# Patient Record
Sex: Female | Born: 1965 | Race: White | Hispanic: No | Marital: Married | Smoking: Never smoker
Health system: Southern US, Community
[De-identification: ages and names within clinical notes are randomized; demographics above are authoritative.]

## PROBLEM LIST (undated history)

## (undated) DIAGNOSIS — F329 Major depressive disorder, single episode, unspecified: Secondary | ICD-10-CM

## (undated) DIAGNOSIS — F32A Depression, unspecified: Secondary | ICD-10-CM

## (undated) DIAGNOSIS — K649 Unspecified hemorrhoids: Secondary | ICD-10-CM

## (undated) DIAGNOSIS — I499 Cardiac arrhythmia, unspecified: Secondary | ICD-10-CM

## (undated) DIAGNOSIS — K589 Irritable bowel syndrome without diarrhea: Secondary | ICD-10-CM

## (undated) DIAGNOSIS — G43909 Migraine, unspecified, not intractable, without status migrainosus: Secondary | ICD-10-CM

## (undated) HISTORY — PX: CYSTECTOMY: SUR359

## (undated) HISTORY — PX: LAPAROSCOPY: SHX197

## (undated) HISTORY — PX: HEMORRHOID SURGERY: SHX153

## (undated) HISTORY — DX: Unspecified hemorrhoids: K64.9

## (undated) HISTORY — PX: WISDOM TOOTH EXTRACTION: SHX21

## (undated) HISTORY — PX: BREAST SURGERY: SHX581

---

## 2003-05-05 ENCOUNTER — Ambulatory Visit (HOSPITAL_COMMUNITY): Admission: RE | Admit: 2003-05-05 | Discharge: 2003-05-05 | Payer: Self-pay | Admitting: Internal Medicine

## 2003-05-05 ENCOUNTER — Encounter: Payer: Self-pay | Admitting: Internal Medicine

## 2003-05-24 ENCOUNTER — Encounter (INDEPENDENT_AMBULATORY_CARE_PROVIDER_SITE_OTHER): Payer: Self-pay | Admitting: Specialist

## 2003-05-24 ENCOUNTER — Ambulatory Visit (HOSPITAL_COMMUNITY): Admission: RE | Admit: 2003-05-24 | Discharge: 2003-05-24 | Payer: Self-pay | Admitting: Internal Medicine

## 2004-04-16 ENCOUNTER — Emergency Department (HOSPITAL_COMMUNITY): Admission: EM | Admit: 2004-04-16 | Discharge: 2004-04-17 | Payer: Self-pay | Admitting: Emergency Medicine

## 2005-07-26 ENCOUNTER — Emergency Department (HOSPITAL_COMMUNITY): Admission: EM | Admit: 2005-07-26 | Discharge: 2005-07-26 | Payer: Self-pay | Admitting: Emergency Medicine

## 2008-02-13 ENCOUNTER — Emergency Department (HOSPITAL_COMMUNITY): Admission: EM | Admit: 2008-02-13 | Discharge: 2008-02-14 | Payer: Self-pay | Admitting: Emergency Medicine

## 2008-07-03 ENCOUNTER — Ambulatory Visit (HOSPITAL_COMMUNITY): Admission: RE | Admit: 2008-07-03 | Discharge: 2008-07-03 | Payer: Self-pay | Admitting: Endocrinology

## 2010-10-07 ENCOUNTER — Other Ambulatory Visit: Payer: Self-pay | Admitting: Radiology

## 2010-10-12 ENCOUNTER — Encounter: Payer: Self-pay | Admitting: Family Medicine

## 2011-02-07 NOTE — H&P (Signed)
NAME:  Anna Hobbs, Anna Hobbs                         ACCOUNT NO.:  [id number]   MEDICAL RECORD NO.:  [id number]                   PATIENT TYPE:  AMB   LOCATION:  ENDO                                 FACILITY:  [facility]   PHYSICIAN:  Lina Sar, M.D. [facility]               DATE OF BIRTH:  04-04-1966   DATE OF ADMISSION:  05/24/2003  DATE OF DISCHARGE:                                HISTORY & PHYSICAL   PROCEDURE:  Colonoscopy.   INDICATIONS:  This 45 year old white female has had chronic diffuse  abdominal pain, off and on for many years.  It has most recently localized  to the right lower quadrant.  The patient is adopted; and therefore, there  is no family history to provide.  The patient has been tried on  antispasmodics as well as on antidepressants with not much relief of her  symptoms.  Her bowel habits are alternating diarrhea and constipation.  She  also has intermittent rectal bleeding.  She is undergoing colonoscopy to  evaluate her for inflammatory bowel disease.  A small-bowel follow through  last week was normal, showing normal terminal ileum.   ENDOSCOPE:  Olympus single channel videoendoscope .   SEDATION:  Versed 7 mg IV, Fentanyl 100 mcg IV.   FINDINGS:  The Olympus single channel videoendoscope was passed through the  sphincter under direct vision through the rectum to the sigmoid colon.  The  patient was monitored by pulse oximeter.  Oxygen saturations were normal.  Her prep was excellent.  Anal canal showed small first grade hemorrhoids.  The rectal ampulla was normal.  Sigmoid colon was normal-appearing with no  evidence of diverticulosis.  Ostial folds were unremarkable.  Colonoscope  passed easily to the left colon to the descending to splenic flexure,  submucosa was unremarkable.  Transverse colon, hepatic flexure, and  ascending colon were traversed without difficulty.  The colon was rather  long.  The right colon and the cecum were unremarkable.  The ileocecal  valve  did not appear to be deformed.  It appeared normal including the appendiceal  opening.  Colonoscope was then retracted and video photographs of the cecum  were obtained.  Multiple random biopsies were taken from mostly the left  colon to rule out microscopic colitis.   IMPRESSION:  1. Normal colonoscopy to the cecum.  2. Status post random biopsies.   PLAN:  There are no structural abnormalities the patient's colon also her  small-bowel follow through was normal so, there is really no evidence of  inflammatory bowel disease.  Her abdominal pain is likely related to  function GI  problem and we will give her a trial of Zelnorm, since she could not  tolerate the antispasmodic , __________.  Zelnorm will be started at a low  dose of 3 mg a day and may be increased gradually to 6 mg twice a day as  needed.  She was  also encouraged to comply with a high-fiber diet.                                                Lina Sar, M.D. [facility]    DB/MEDQ  D:  05/24/2003  T:  05/24/2003  Job:  [id number]   cc:   Tammy R. Collins [place], M.D.  [address]  [place]  [place] [address]  Fax: [phone]

## 2011-06-18 LAB — POCT CARDIAC MARKERS
CKMB, poc: 1.1
CKMB, poc: 1.2
Myoglobin, poc: 70.4

## 2011-06-18 LAB — POCT I-STAT, CHEM 8
HCT: 35 — ABNORMAL LOW
Hemoglobin: 11.9 — ABNORMAL LOW
Potassium: 3.4 — ABNORMAL LOW
Sodium: 142
TCO2: 17

## 2011-07-02 ENCOUNTER — Emergency Department (HOSPITAL_COMMUNITY): Payer: 59

## 2011-07-02 ENCOUNTER — Emergency Department (HOSPITAL_COMMUNITY)
Admission: EM | Admit: 2011-07-02 | Discharge: 2011-07-02 | Disposition: A | Discharge status: Home / Self Care | Payer: 59 | Attending: Emergency Medicine | Admitting: Emergency Medicine

## 2011-07-02 DIAGNOSIS — G43909 Migraine, unspecified, not intractable, without status migrainosus: Secondary | ICD-10-CM | POA: Insufficient documentation

## 2011-07-02 DIAGNOSIS — W2209XA Striking against other stationary object, initial encounter: Secondary | ICD-10-CM | POA: Insufficient documentation

## 2011-07-02 DIAGNOSIS — Y92009 Unspecified place in unspecified non-institutional (private) residence as the place of occurrence of the external cause: Secondary | ICD-10-CM | POA: Insufficient documentation

## 2011-07-02 DIAGNOSIS — F988 Other specified behavioral and emotional disorders with onset usually occurring in childhood and adolescence: Secondary | ICD-10-CM | POA: Insufficient documentation

## 2011-07-02 DIAGNOSIS — Y998 Other external cause status: Secondary | ICD-10-CM | POA: Insufficient documentation

## 2011-07-02 DIAGNOSIS — S060X0A Concussion without loss of consciousness, initial encounter: Secondary | ICD-10-CM | POA: Insufficient documentation

## 2011-07-02 DIAGNOSIS — Z79899 Other long term (current) drug therapy: Secondary | ICD-10-CM | POA: Insufficient documentation

## 2011-07-02 DIAGNOSIS — I499 Cardiac arrhythmia, unspecified: Secondary | ICD-10-CM | POA: Insufficient documentation

## 2012-04-29 ENCOUNTER — Emergency Department (HOSPITAL_COMMUNITY)
Admission: EM | Admit: 2012-04-29 | Discharge: 2012-04-30 | Disposition: A | Discharge status: Home / Self Care | Payer: 59 | Attending: Emergency Medicine | Admitting: Emergency Medicine

## 2012-04-29 ENCOUNTER — Encounter (HOSPITAL_COMMUNITY): Payer: Self-pay | Admitting: Emergency Medicine

## 2012-04-29 DIAGNOSIS — K644 Residual hemorrhoidal skin tags: Secondary | ICD-10-CM | POA: Insufficient documentation

## 2012-04-29 DIAGNOSIS — K649 Unspecified hemorrhoids: Secondary | ICD-10-CM

## 2012-04-29 HISTORY — DX: Migraine, unspecified, not intractable, without status migrainosus: G43.909

## 2012-04-29 LAB — COMPREHENSIVE METABOLIC PANEL
AST: 18 U/L (ref 0–37)
BUN: 12 mg/dL (ref 6–23)
CO2: 25 mEq/L (ref 19–32)
Calcium: 9.1 mg/dL (ref 8.4–10.5)
Chloride: 104 mEq/L (ref 96–112)
Creatinine, Ser: 1.05 mg/dL (ref 0.50–1.10)
GFR calc non Af Amer: 63 mL/min — ABNORMAL LOW (ref >90)
Total Bilirubin: 0.5 mg/dL (ref 0.3–1.2)

## 2012-04-29 LAB — CBC
HCT: 40.9 % (ref 36.0–46.0)
MCH: 28.8 pg (ref 26.0–34.0)
MCV: 86.5 fL (ref 78.0–100.0)
Platelets: 175 10*3/uL (ref 150–400)
RBC: 4.73 MIL/uL (ref 3.87–5.11)
RDW: 13.8 % (ref 11.5–15.5)
WBC: 5.8 10*3/uL (ref 4.0–10.5)

## 2012-04-29 LAB — ACETAMINOPHEN LEVEL: Acetaminophen (Tylenol), Serum: <15 ug/mL (ref 10–30)

## 2012-04-29 LAB — ETHANOL: Alcohol, Ethyl (B): <11 mg/dL (ref 0–11)

## 2012-04-29 NOTE — ED Notes (Signed)
Bed:WA16<BR> Expected date:<BR> Expected time:<BR> Means of arrival:<BR> Comments:<BR> Triage 

## 2012-04-29 NOTE — ED Notes (Signed)
Pt endorses C-diff in January 2013, developed Hemorroids, internal and external dx by flex sigmoidoscopy by [facility] in [place]. Has had only Sx treatment since w/o any resolution. Next appt 05/11/12. Pt extremely frustrated and is needs some intervention for pain. Is missing work and unable to sleep d/t pain.

## 2012-04-30 MED ORDER — HYDROCORTISONE 2.5 % RE CREA: TOPICAL_CREAM | Freq: Two times a day (BID) | RECTAL | Status: AC

## 2012-04-30 MED ORDER — LIDOCAINE 5 % EX OINT
TOPICAL_OINTMENT | Freq: Every day | CUTANEOUS | Status: DC | PRN
  Administered 2012-04-30: 01:00:00 via TOPICAL
  Filled 2012-04-30: qty 35.44

## 2012-04-30 MED ORDER — OXYCODONE-ACETAMINOPHEN 5-325 MG PO TABS: 1.0000 | ORAL_TABLET | Freq: Four times a day (QID) | ORAL | Status: AC | PRN

## 2012-04-30 MED ORDER — OXYCODONE-ACETAMINOPHEN 5-325 MG PO TABS
1.0000 | ORAL_TABLET | Freq: Once | ORAL | Status: AC
  Administered 2012-04-30: 1 via ORAL
  Filled 2012-04-30: qty 1

## 2012-04-30 NOTE — ED Provider Notes (Signed)
Medical screening examination/treatment/procedure(s) were performed by non-physician practitioner and as supervising physician I was immediately available for consultation/collaboration.  Sunnie Nielsen, MD 04/30/12 [phone]

## 2012-04-30 NOTE — ED Provider Notes (Signed)
History     CSN: [id number]  Arrival date & time 04/29/12  2007   First MD Initiated Contact with Patient 04/30/12 0005      Chief Complaint  Patient presents with  . Hemorrhoids    since January, unable to see surgeon for surgical intervention    (Consider location/radiation/quality/duration/timing/severity/associated sxs/prior treatment) HPI Comments: Patient 46 year old female that presents emergency department with the chief complaint of extreme pain from internal and external hemorrhoids.  Patient states that she was diagnosed with C. 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  Patient reports that she had a flex sigmoidoscopy confirming this diagnosis recently.  She's been placed on stool softeners and linzess without any relief.  She denies fever, night sweats, chills.  No other complaints at this time.  The history is provided by the patient.    Past Medical History  Diagnosis Date  . Migraine     Past Surgical History  Procedure Date  . Laparoscopy   . Breast surgery     needle core bx    No family history on file.  History  Substance Use Topics  . Smoking status: Never Smoker   . Smokeless tobacco: Never Used  . Alcohol Use: No    OB History    Grav Para Term Preterm Abortions TAB SAB Ect Mult Living                  Review of Systems  Constitutional: Negative for fever, chills and appetite change.  HENT: Negative for congestion.   Eyes: Negative for visual disturbance.  Respiratory: Negative for shortness of breath.   Cardiovascular: Negative for chest pain and leg swelling.  Gastrointestinal: Positive for rectal pain. Negative for abdominal pain.  Genitourinary: Negative for dysuria, urgency and frequency.  Neurological: Negative for dizziness, syncope, weakness, light-headedness, numbness and headaches.  Psychiatric/Behavioral: Negative for confusion.  All other systems reviewed and are  negative.    Allergies  Azithromycin; Cefdinir; and Metronidazole  Home Medications   Current Outpatient Rx  Name Route Sig Dispense Refill  . BUPROPION HCL ER (SR) 200 MG PO TB12 Oral Take 200 mg by mouth 2 (two) times daily.    Marland Kitchen METOPROLOL SUCCINATE ER 25 MG PO TB24 Oral Take 25 mg by mouth at bedtime.    Marland Kitchen NAPROXEN SODIUM 220 MG PO CAPS Oral Take 220 mg by mouth 2 (two) times daily as needed. For pain    . TOPIRAMATE 50 MG PO TABS Oral Take 50 mg by mouth at bedtime.      BP 110/77  Pulse 91  Temp 98 F (36.7 C) (Oral)  Resp 18  Ht 5\' 2"  (1.575 m)  Wt 122 lb (55.339 kg)  BMI 22.31 kg/m2  SpO2 99%  LMP 04/29/2012  Physical Exam  Nursing note and vitals reviewed. Constitutional: She is oriented to person, place, and time. She appears well-developed and well-nourished. No distress.  HENT:  Head: Normocephalic and atraumatic.  Eyes: Conjunctivae and EOM are normal.  Neck: Normal range of motion.  Pulmonary/Chest: Effort normal.  Genitourinary:       Rectal exam chaperoned.   showed no evidence of para rectal abscess.  Extreme tenderness to palpation of external hemorrhoids. NO evidence of incarnation or strangulation. No active bleeding.  Rectal exam not tolerating the due to pain   Musculoskeletal: Normal range of motion.  Neurological: She is alert and oriented to person, place, and time.  Skin:  Skin is warm and dry. No rash noted. She is not diaphoretic.  Psychiatric: She has a normal mood and affect. Her behavior is normal.    ED Course  Procedures (including critical care time)  Labs Reviewed  COMPREHENSIVE METABOLIC PANEL - Abnormal; Notable for the following:    Potassium 3.4 (*)     Glucose, Bld 112 (*)     GFR calc non Af Amer 63 (*)     GFR calc Af Amer 73 (*)     All other components within normal limits  CBC  ETHANOL  ACETAMINOPHEN LEVEL   No results found.   No diagnosis found.    MDM  Hemorrhoids  Presents to ER c/o hemorrhoids after  failing at home therapy. Pt stable without evidence of incarnation or strangulation. Patient will be discharged with instructions for sitz bath, Percocet prescription, lidocaine ointment, and instructions to get a donut.  Recommended consultation with general surgery for removal of hemorrhoids.  Patient is agreeable to plan.          Jaci Carrel, PA-C 04/30/12 0049  Jaci Carrel, PA-C 04/30/12 [phone]

## 2012-05-14 ENCOUNTER — Encounter (INDEPENDENT_AMBULATORY_CARE_PROVIDER_SITE_OTHER): Payer: Self-pay | Admitting: Surgery

## 2012-05-14 ENCOUNTER — Ambulatory Visit (INDEPENDENT_AMBULATORY_CARE_PROVIDER_SITE_OTHER): Payer: 59 | Admitting: Surgery

## 2012-05-14 VITALS — BP 100/62 | HR 72 | Temp 98.5°F | Resp 16 | Ht 62.0 in | Wt 122.4 lb

## 2012-05-14 DIAGNOSIS — K649 Unspecified hemorrhoids: Secondary | ICD-10-CM

## 2012-05-14 DIAGNOSIS — G43909 Migraine, unspecified, not intractable, without status migrainosus: Secondary | ICD-10-CM | POA: Insufficient documentation

## 2012-05-14 MED ORDER — TRAMADOL HCL 50 MG PO TABS: 50.0000 mg | ORAL_TABLET | Freq: Four times a day (QID) | ORAL | Status: AC | PRN

## 2012-05-14 NOTE — Patient Instructions (Signed)
Hemorrhoidectomy Hemorrhoidectomy is surgery to remove hemorrhoids. Hemorrhoids are veins that have become swollen in the rectum. The rectum is the area from the bottom end of the intestines to the opening where bowel movements leave the body. Hemorrhoids can be uncomfortable. They can cause itching, bleeding and pain if a blood clot forms in them (thrombose). If hemorrhoids are small, surgery may not be needed. But if they cover a larger area, surgery is usually suggested.  LET YOUR CAREGIVER KNOW ABOUT:   Any allergies.   All medications you are taking, including:   Herbs, eyedrops, over-the-counter medications and creams.   Blood thinners (anticoagulants), aspirin or other drugs that could affect blood clotting.   Use of steroids (by mouth or as creams).   Previous problems with anesthetics, including local anesthetics.   Possibility of pregnancy, if this applies.   Any history of blood clots.   Any history of bleeding or other blood problems.   Previous surgery.   Smoking history.   Other health problems.  RISKS AND COMPLICATIONS All surgery carries some risk. However, hemorrhoid surgery usually goes smoothly. Possible complications could include:  Urinary retention.   Bleeding.   Infection.   A painful incision.   A reaction to the anesthesia (this is not common).  BEFORE THE PROCEDURE   Stop using aspirin and non-steroidal anti-inflammatory drugs (NSAIDs) for pain relief. This includes prescription drugs and over-the-counter drugs such as ibuprofen and naproxen. Also stop taking vitamin E. If possible, do this two weeks before your surgery.   If you take blood-thinners, ask your healthcare provider when you should stop taking them.   You will probably have blood and urine tests done several days before your surgery.   Do not eat or drink for about 8 hours before the surgery.   Arrive at least an hour before the surgery, or whenever your surgeon recommends. This  will give you time to check in and fill out any needed paperwork.   Hemorrhoidectomy is often an outpatient procedure. This means you will be able to go home the same day. Sometimes, though, people stay overnight in the hospital after the procedure. Ask your surgeon what to expect. Either way, make arrangements in advance for someone to drive you home.  PROCEDURE   The preparation:   You will change into a hospital gown.   You will be given an IV. A needle will be inserted in your arm. Medication can flow directly into your body through this needle.   You might be given an enema to clear your rectum.   Once in the operating room, you will probably lie on your side or be repositioned later to lying on your stomach.   You will be given anesthesia (medication) so you will not feel anything during the surgery. The surgery often is done with local anesthesia (the area near the hemorrhoids will be numb and you will be drowsy but awake). Sometimes, general anesthesia is used (you will be asleep during the procedure).   The procedure:   There are a few different procedures for hemorrhoids. Be sure to ask you surgeon about the procedure, the risks and benefits.   Be sure to ask about what you need to do to take care of the wound, if there is one.  AFTER THE PROCEDURE  You will stay in a recovery area until the anesthesia has worn off. Your blood pressure and pulse will be checked every so often.   You may feel a lot of  pain in the area of the rectum.   Take all pain medication prescribed by your surgeon. Ask before taking any over-the-counter pain medicines.   Sometimes sitting in a warm bath can help relieve your pain.   To make sure you have bowel movements without straining:   You will probably need to take stool softeners (usually a pill) for a few days.   You should drink 8 to 10 glasses of water each day.   Your activity will be restricted for awhile. Ask your caregiver for a list  of what you should and should not do while you recover.  Document Released: 07/06/2009 Document Revised: 08/28/2011 Document Reviewed: 07/06/2009 [facility] Patient Information 2012 [place], [place].

## 2012-05-14 NOTE — Progress Notes (Signed)
Chief Complaint:  Painful hemorrhoids  History of Present Illness:  Anna Hobbs is an 46 y.o. female who is referred by Dr. Drue Novel with hemorrhoids.  She has had C. difficile since taking a Z-Pak earlier this year. With her diarrhea that is exacerbated her hemorrhoids that she got originally with pregnancy. She denies much in the way of bleeding amount of severe pain that doubled her over and caused her to sleep sitting up.  Following examination I discussed my findings which included some modest internal hemorrhoids posteriorly that could possibly prolapse and asymptomatic lung was external skin tags, what on palpation feels to be maybe a fissure posteriorly which may well account for her severe pain and spasm. I discussed internal sphincterotomy with her in some detail we gave her a booklet on this. Also discussed hemorrhoidectomy in the rationale for exam under anesthesia. Will try her on diltiazem cream first to see if that helps his pain. I was to try to avoid hemorrhoidectomy if possible but I think she and her husband want these addressed surgically.  The plan is for an EUA, lateral internal sphincterotomy, possible hemorrhoidectomy.  Past Medical History  Diagnosis Date  . Migraine     Past Surgical History  Procedure Date  . Laparoscopy   . Breast surgery     needle core bx  . Cystectomy     Current Outpatient Prescriptions  Medication Sig Dispense Refill  . buPROPion (WELLBUTRIN SR) 200 MG 12 hr tablet Take 200 mg by mouth 2 (two) times daily.      . metoprolol succinate (TOPROL-XL) 25 MG 24 hr tablet Take 25 mg by mouth at bedtime.      . Naproxen Sodium (ALEVE) 220 MG CAPS Take 220 mg by mouth 2 (two) times daily as needed. For pain      . topiramate (TOPAMAX) 50 MG tablet Take 50 mg by mouth at bedtime.      . traMADol (ULTRAM) 50 MG tablet Take 1-2 tablets (50-100 mg total) by mouth every 6 (six) hours as needed for pain.  30 tablet  1   Azithromycin; Cefdinir; and  Metronidazole Family History  Problem Relation Age of Onset  . Adopted: Yes  . Family history unknown: Yes   Social History:   reports that she has never smoked. She has never used smokeless tobacco. She reports that she does not drink alcohol or use illicit drugs.   REVIEW OF SYSTEMS - PERTINENT POSITIVES ONLY: She takes Topamax for migraines  Physical Exam:   Blood pressure 100/62, pulse 72, temperature 98.5 F (36.9 C), resp. rate 16, height 5\' 2"  (1.575 m), weight 122 lb 6.4 oz (55.52 kg), last menstrual period 04/29/2012. Body mass index is 22.39 kg/(m^2).  Gen:  WDWN white female NAD  Neurological: Alert and oriented to person, place, and time. Motor and sensory function is grossly intact  Head: Normocephalic and atraumatic.  Eyes: Conjunctivae are normal. Pupils are equal, round, and reactive to light. No scleral icterus.  Neck: Normal range of motion. Neck supple. No tracheal deviation or thyromegaly present.  Cardiovascular:  SR without murmurs or gallops.  No carotid bruits Respiratory: Effort normal.  No respiratory distress. No chest wall tenderness. Breath sounds normal.  No wheezes, rales or rhonchi.  Abdomen:  nontender GU: Rectal:  Posterior column hemorrhoids.  Tender area posteriorally likely a fissure Musculoskeletal: Normal range of motion. Extremities are nontender. No cyanosis, edema or clubbing noted Lymphadenopathy: No cervical, preauricular, postauricular or axillary adenopathy is present  Skin: Skin is warm and dry. No rash noted. No diaphoresis. No erythema. No pallor. Pscyh: Normal mood and affect. Behavior is normal. Judgment and thought content normal.   LABORATORY RESULTS: No results found for this or any previous visit (from the past 48 hour(s)).  RADIOLOGY RESULTS: No results found.  Problem List: There is no problem list on file for this patient.   Assessment & Plan: Anal spasm, hemorrhoids and probable fissure.  Plan EUA with lat int  sphincteroromy and hemorrhoidectomy    Matt B. Daphine Deutscher, MD, [facility] [phone] beeper [phone]  05/14/2012 2:27 PM

## 2012-05-17 ENCOUNTER — Other Ambulatory Visit: Payer: Self-pay

## 2012-05-17 ENCOUNTER — Encounter (HOSPITAL_BASED_OUTPATIENT_CLINIC_OR_DEPARTMENT_OTHER)
Admission: RE | Admit: 2012-05-17 | Discharge: 2012-05-17 | Disposition: A | Discharge status: Home / Self Care | Payer: 59 | Source: Ambulatory Visit | Attending: Surgery | Admitting: Surgery

## 2012-05-17 ENCOUNTER — Encounter (HOSPITAL_BASED_OUTPATIENT_CLINIC_OR_DEPARTMENT_OTHER): Payer: Self-pay | Admitting: *Deleted

## 2012-05-17 LAB — BASIC METABOLIC PANEL
Calcium: 9.3 mg/dL (ref 8.4–10.5)
Creatinine, Ser: 0.96 mg/dL (ref 0.50–1.10)
GFR calc Af Amer: 81 mL/min — ABNORMAL LOW (ref >90)
GFR calc non Af Amer: 70 mL/min — ABNORMAL LOW (ref >90)
Sodium: 138 mEq/L (ref 135–145)

## 2012-05-17 NOTE — Progress Notes (Signed)
Here for ekg-bmet-toprol for hx pvc-will try to find old recoerds-not sure who she saw about 2008

## 2012-05-19 ENCOUNTER — Encounter (HOSPITAL_BASED_OUTPATIENT_CLINIC_OR_DEPARTMENT_OTHER): Payer: Self-pay | Admitting: Anesthesiology

## 2012-05-19 ENCOUNTER — Ambulatory Visit (HOSPITAL_BASED_OUTPATIENT_CLINIC_OR_DEPARTMENT_OTHER): Payer: 59 | Admitting: Anesthesiology

## 2012-05-19 ENCOUNTER — Ambulatory Visit (HOSPITAL_BASED_OUTPATIENT_CLINIC_OR_DEPARTMENT_OTHER)
Admission: RE | Admit: 2012-05-19 | Discharge: 2012-05-19 | Disposition: A | Discharge status: Home / Self Care | Payer: 59 | Source: Ambulatory Visit | Attending: Surgery | Admitting: Surgery

## 2012-05-19 ENCOUNTER — Encounter (HOSPITAL_BASED_OUTPATIENT_CLINIC_OR_DEPARTMENT_OTHER)
Admission: RE | Disposition: A | Discharge status: Home / Self Care | Payer: Self-pay | Source: Ambulatory Visit | Attending: Surgery

## 2012-05-19 ENCOUNTER — Encounter (HOSPITAL_BASED_OUTPATIENT_CLINIC_OR_DEPARTMENT_OTHER): Payer: Self-pay

## 2012-05-19 DIAGNOSIS — F329 Major depressive disorder, single episode, unspecified: Secondary | ICD-10-CM | POA: Insufficient documentation

## 2012-05-19 DIAGNOSIS — K648 Other hemorrhoids: Secondary | ICD-10-CM

## 2012-05-19 DIAGNOSIS — K644 Residual hemorrhoidal skin tags: Secondary | ICD-10-CM | POA: Insufficient documentation

## 2012-05-19 DIAGNOSIS — F3289 Other specified depressive episodes: Secondary | ICD-10-CM | POA: Insufficient documentation

## 2012-05-19 DIAGNOSIS — G43909 Migraine, unspecified, not intractable, without status migrainosus: Secondary | ICD-10-CM | POA: Insufficient documentation

## 2012-05-19 DIAGNOSIS — I499 Cardiac arrhythmia, unspecified: Secondary | ICD-10-CM | POA: Insufficient documentation

## 2012-05-19 HISTORY — DX: Major depressive disorder, single episode, unspecified: F32.9

## 2012-05-19 HISTORY — DX: Irritable bowel syndrome, unspecified: K58.9

## 2012-05-19 HISTORY — DX: Cardiac arrhythmia, unspecified: I49.9

## 2012-05-19 HISTORY — DX: Depression, unspecified: F32.A

## 2012-05-19 LAB — POCT HEMOGLOBIN-HEMACUE: Hemoglobin: 11.5 g/dL — ABNORMAL LOW (ref 12.0–15.0)

## 2012-05-19 SURGERY — SPHINCTEROTOMY, ANAL
Anesthesia: General | Site: Anus | Wound class: Clean Contaminated

## 2012-05-19 MED ORDER — ACETAMINOPHEN 325 MG PO TABS: 650.0000 mg | ORAL_TABLET | ORAL | Status: DC | PRN

## 2012-05-19 MED ORDER — OXYCODONE HCL 5 MG/5ML PO SOLN: 5.0000 mg | Freq: Once | ORAL | Status: AC | PRN

## 2012-05-19 MED ORDER — FENTANYL CITRATE 0.05 MG/ML IJ SOLN
INTRAMUSCULAR | Status: DC | PRN
  Administered 2012-05-19: 100 ug via INTRAVENOUS

## 2012-05-19 MED ORDER — SCOPOLAMINE 1 MG/3DAYS TD PT72
1.0000 | MEDICATED_PATCH | Freq: Once | TRANSDERMAL | Status: DC
  Administered 2012-05-19: 1.5 mg via TRANSDERMAL

## 2012-05-19 MED ORDER — BUPIVACAINE LIPOSOME 1.3 % IJ SUSP
INTRAMUSCULAR | Status: DC | PRN
  Administered 2012-05-19: 20 mL

## 2012-05-19 MED ORDER — SODIUM CHLORIDE 0.9 % IJ SOLN: 3.0000 mL | Freq: Two times a day (BID) | INTRAMUSCULAR | Status: DC

## 2012-05-19 MED ORDER — OXYCODONE HCL 5 MG PO TABS: 5.0000 mg | ORAL_TABLET | ORAL | Status: DC | PRN

## 2012-05-19 MED ORDER — MIDAZOLAM HCL 5 MG/5ML IJ SOLN
INTRAMUSCULAR | Status: DC | PRN
  Administered 2012-05-19: 2 mg via INTRAVENOUS

## 2012-05-19 MED ORDER — METOCLOPRAMIDE HCL 5 MG/ML IJ SOLN: 10.0000 mg | Freq: Once | INTRAMUSCULAR | Status: DC | PRN

## 2012-05-19 MED ORDER — ONDANSETRON HCL 4 MG/2ML IJ SOLN: 4.0000 mg | Freq: Four times a day (QID) | INTRAMUSCULAR | Status: DC | PRN

## 2012-05-19 MED ORDER — OXYCODONE-ACETAMINOPHEN 5-325 MG PO TABS: 1.0000 | ORAL_TABLET | ORAL | Status: AC | PRN

## 2012-05-19 MED ORDER — LACTATED RINGERS IV SOLN
INTRAVENOUS | Status: DC
  Administered 2012-05-19 (×2): via INTRAVENOUS

## 2012-05-19 MED ORDER — FLEET ENEMA 7-19 GM/118ML RE ENEM: 1.0000 | ENEMA | Freq: Once | RECTAL | Status: DC

## 2012-05-19 MED ORDER — CIPROFLOXACIN IN D5W 400 MG/200ML IV SOLN
400.0000 mg | INTRAVENOUS | Status: AC
  Administered 2012-05-19: 400 mg via INTRAVENOUS

## 2012-05-19 MED ORDER — KCL IN DEXTROSE-NACL 20-5-0.45 MEQ/L-%-% IV SOLN: INTRAVENOUS | Status: DC

## 2012-05-19 MED ORDER — METOPROLOL SUCCINATE ER 25 MG PO TB24
25.0000 mg | ORAL_TABLET | Freq: Every day | ORAL | Status: DC
  Administered 2012-05-19: 25 mg via ORAL

## 2012-05-19 MED ORDER — SODIUM CHLORIDE 0.9 % IV SOLN: 250.0000 mL | INTRAVENOUS | Status: DC | PRN

## 2012-05-19 MED ORDER — MORPHINE SULFATE 2 MG/ML IJ SOLN: 1.0000 mg | INTRAMUSCULAR | Status: DC | PRN

## 2012-05-19 MED ORDER — HEPARIN SODIUM (PORCINE) 5000 UNIT/ML IJ SOLN
5000.0000 [IU] | Freq: Once | INTRAMUSCULAR | Status: AC
  Administered 2012-05-19: 5000 [IU] via SUBCUTANEOUS

## 2012-05-19 MED ORDER — ACETAMINOPHEN 10 MG/ML IV SOLN
1000.0000 mg | Freq: Once | INTRAVENOUS | Status: AC
  Administered 2012-05-19: 1000 mg via INTRAVENOUS

## 2012-05-19 MED ORDER — LIDOCAINE HCL (CARDIAC) 20 MG/ML IV SOLN
INTRAVENOUS | Status: DC | PRN
  Administered 2012-05-19: 50 mg via INTRAVENOUS

## 2012-05-19 MED ORDER — ACETAMINOPHEN 650 MG RE SUPP: 650.0000 mg | RECTAL | Status: DC | PRN

## 2012-05-19 MED ORDER — PROPOFOL 10 MG/ML IV BOLUS
INTRAVENOUS | Status: DC | PRN
  Administered 2012-05-19: 200 mg via INTRAVENOUS

## 2012-05-19 MED ORDER — HYDROMORPHONE HCL PF 1 MG/ML IJ SOLN: 0.2500 mg | INTRAMUSCULAR | Status: DC | PRN

## 2012-05-19 MED ORDER — SODIUM CHLORIDE 0.9 % IJ SOLN: 3.0000 mL | INTRAMUSCULAR | Status: DC | PRN

## 2012-05-19 MED ORDER — OXYCODONE HCL 5 MG PO TABS
5.0000 mg | ORAL_TABLET | Freq: Once | ORAL | Status: AC | PRN
  Administered 2012-05-19: 5 mg via ORAL

## 2012-05-19 SURGICAL SUPPLY — 33 items
BLADE SURG 15 STRL LF DISP TIS (BLADE) ×1 IMPLANT
BLADE SURG 15 STRL SS (BLADE) ×2
CANISTER SUCTION 1200CC (MISCELLANEOUS) ×2 IMPLANT
CLOTH BEACON ORANGE TIMEOUT ST (SAFETY) ×2 IMPLANT
COVER MAYO STAND STRL (DRAPES) IMPLANT
DECANTER SPIKE VIAL GLASS SM (MISCELLANEOUS) ×2 IMPLANT
DRSG PAD ABDOMINAL 8X10 ST (GAUZE/BANDAGES/DRESSINGS) IMPLANT
ELECT REM PT RETURN 9FT ADLT (ELECTROSURGICAL) ×2
ELECTRODE REM PT RTRN 9FT ADLT (ELECTROSURGICAL) ×1 IMPLANT
GAUZE SPONGE 4X4 12PLY STRL LF (GAUZE/BANDAGES/DRESSINGS) ×4 IMPLANT
GAUZE VASELINE 1X8 (GAUZE/BANDAGES/DRESSINGS) IMPLANT
GLOVE BIO SURGEON STRL SZ 6.5 (GLOVE) ×1 IMPLANT
GLOVE BIO SURGEON STRL SZ8 (GLOVE) ×2 IMPLANT
GOWN PREVENTION PLUS XLARGE (GOWN DISPOSABLE) ×2 IMPLANT
GOWN PREVENTION PLUS XXLARGE (GOWN DISPOSABLE) ×2 IMPLANT
NDL HYPO 25X1 1.5 SAFETY (NEEDLE) ×1 IMPLANT
NEEDLE HYPO 25X1 1.5 SAFETY (NEEDLE) ×2 IMPLANT
PACK BASIN DAY SURGERY FS (CUSTOM PROCEDURE TRAY) ×2 IMPLANT
PACK LITHOTOMY IV (CUSTOM PROCEDURE TRAY) ×2 IMPLANT
PENCIL BUTTON HOLSTER BLD 10FT (ELECTRODE) ×2 IMPLANT
SHEET MEDIUM DRAPE 40X70 STRL (DRAPES) ×2 IMPLANT
SLEEVE SCD COMPRESS KNEE MED (MISCELLANEOUS) IMPLANT
SURGILUBE 2OZ TUBE FLIPTOP (MISCELLANEOUS) ×2 IMPLANT
SUT CHROMIC 2 0 SH (SUTURE) IMPLANT
SUT CHROMIC 3 0 SH 27 (SUTURE) ×3 IMPLANT
SYR CONTROL 10ML LL (SYRINGE) ×2 IMPLANT
TOWEL OR 17X24 6PK STRL BLUE (TOWEL DISPOSABLE) ×4 IMPLANT
TRAY DSU PREP LF (CUSTOM PROCEDURE TRAY) ×2 IMPLANT
TRAY PROCTOSCOPIC FIBER OPTIC (SET/KITS/TRAYS/PACK) IMPLANT
TUBE CONNECTING 20X1/4 (TUBING) ×2 IMPLANT
UNDERPAD 30X30 INCONTINENT (UNDERPADS AND DIAPERS) ×2 IMPLANT
WATER STERILE IRR 1000ML POUR (IV SOLUTION) ×1 IMPLANT
YANKAUER SUCT BULB TIP NO VENT (SUCTIONS) ×2 IMPLANT

## 2012-05-19 NOTE — Anesthesia Preprocedure Evaluation (Signed)
Anesthesia Evaluation  Patient identified by MRN, date of birth, ID band Patient awake    Reviewed: Allergy & Precautions, H&P , NPO status , Patient's Chart, lab work & pertinent test results, reviewed documented beta blocker date and time   Airway Mallampati: II TM Distance: >3 FB Neck ROM: full    Dental   Pulmonary neg pulmonary ROS,  breath sounds clear to auscultation        Cardiovascular + dysrhythmias Rhythm:regular     Neuro/Psych  Headaches, PSYCHIATRIC DISORDERS Depression    GI/Hepatic negative GI ROS, Neg liver ROS,   Endo/Other  negative endocrine ROS  Renal/GU negative Renal ROS  negative genitourinary   Musculoskeletal   Abdominal   Peds  Hematology negative hematology ROS (+)   Anesthesia Other Findings See surgeon's H&P   Reproductive/Obstetrics negative OB ROS                           Anesthesia Physical Anesthesia Plan  ASA: II  Anesthesia Plan: General   Post-op Pain Management:    Induction: Intravenous  Airway Management Planned: LMA  Additional Equipment:   Intra-op Plan:   Post-operative Plan: Extubation in OR  Informed Consent: I have reviewed the patients History and Physical, chart, labs and discussed the procedure including the risks, benefits and alternatives for the proposed anesthesia with the patient or authorized representative who has indicated his/her understanding and acceptance.   Dental Advisory Given  Plan Discussed with: CRNA and Surgeon  Anesthesia Plan Comments:         Anesthesia Quick Evaluation

## 2012-05-19 NOTE — Anesthesia Procedure Notes (Signed)
Procedure Name: LMA Insertion Date/Time: 05/19/2012 12:52 PM Performed by: Gar Gibbon Pre-anesthesia Checklist: Patient identified, Emergency Drugs available, Suction available and Patient being monitored Patient Re-evaluated:Patient Re-evaluated prior to inductionOxygen Delivery Method: Circle System Utilized Preoxygenation: Pre-oxygenation with 100% oxygen Intubation Type: IV induction Ventilation: Mask ventilation without difficulty LMA: LMA inserted LMA Size: 4.0 Number of attempts: 1 Airway Equipment and Method: bite block Placement Confirmation: positive ETCO2 Tube secured with: Tape Dental Injury: Teeth and Oropharynx as per pre-operative assessment

## 2012-05-19 NOTE — Anesthesia Postprocedure Evaluation (Signed)
Anesthesia Post Note  Patient: Anna Hobbs  Procedure(s) Performed: Procedure(s) (LRB): SPHINCTEROTOMY (N/A) EXAM UNDER ANESTHESIA WITH HEMORRHOIDECTOMY (N/A)  Anesthesia type: General  Patient location: PACU  Post pain: Pain level controlled  Post assessment: Patient's Cardiovascular Status Stable  Last Vitals:  Filed Vitals:   05/19/12 1515  BP: 102/56  Pulse: 54  Temp:   Resp: 17    Post vital signs: Reviewed and stable  Level of consciousness: alert  Complications: No apparent anesthesia complications

## 2012-05-19 NOTE — Transfer of Care (Signed)
Immediate Anesthesia Transfer of Care Note  Patient: Anna Hobbs  Procedure(s) Performed: Procedure(s) (LRB): SPHINCTEROTOMY (N/A) EXAM UNDER ANESTHESIA WITH HEMORRHOIDECTOMY (N/A)  Patient Location: PACU  Anesthesia Type: General  Level of Consciousness: sedated and patient cooperative  Airway & Oxygen Therapy: Patient Spontanous Breathing and Patient connected to face mask oxygen  Post-op Assessment: Report given to PACU RN and Post -op Vital signs reviewed and stable  Post vital signs: Reviewed and stable  Complications: No apparent anesthesia complications

## 2012-05-19 NOTE — H&P (Signed)
Chief Complaint: Painful hemorrhoids  History of Present Illness: Anna Hobbs is an 46 y.o. female who is referred by Dr. Drue Novel with hemorrhoids. She has had C. difficile since taking a Z-Pak earlier this year. With her diarrhea that is exacerbated her hemorrhoids that she got originally with pregnancy. She denies much in the way of bleeding amount of severe pain that doubled her over and caused her to sleep sitting up.  Following examination I discussed my findings which included some modest internal hemorrhoids posteriorly that could possibly prolapse and asymptomatic lung was external skin tags, what on palpation feels to be maybe a fissure posteriorly which may well account for her severe pain and spasm. I discussed internal sphincterotomy with her in some detail we gave her a booklet on this. Also discussed hemorrhoidectomy in the rationale for exam under anesthesia. Will try her on diltiazem cream first to see if that helps his pain. I was to try to avoid hemorrhoidectomy if possible but I think she and her husband want these addressed surgically.  The plan is for an EUA, lateral internal sphincterotomy, possible hemorrhoidectomy.  Past Medical History   Diagnosis  Date   .  Migraine     Past Surgical History   Procedure  Date   .  Laparoscopy    .  Breast surgery      needle core bx   .  Cystectomy     Current Outpatient Prescriptions   Medication  Sig  Dispense  Refill   .  buPROPion (WELLBUTRIN SR) 200 MG 12 hr tablet  Take 200 mg by mouth 2 (two) times daily.     .  metoprolol succinate (TOPROL-XL) 25 MG 24 hr tablet  Take 25 mg by mouth at bedtime.     .  Naproxen Sodium (ALEVE) 220 MG CAPS  Take 220 mg by mouth 2 (two) times daily as needed. For pain     .  topiramate (TOPAMAX) 50 MG tablet  Take 50 mg by mouth at bedtime.     .  traMADol (ULTRAM) 50 MG tablet  Take 1-2 tablets (50-100 mg total) by mouth every 6 (six) hours as needed for pain.  30 tablet  1    Azithromycin;  Cefdinir; and Metronidazole  Family History   Problem  Relation  Age of Onset   .  Adopted: Yes   .  Family history unknown: Yes    Social History: reports that she has never smoked. She has never used smokeless tobacco. She reports that she does not drink alcohol or use illicit drugs.  REVIEW OF SYSTEMS - PERTINENT POSITIVES ONLY:  She takes Topamax for migraines  Physical Exam:  Blood pressure 100/62, pulse 72, temperature 98.5 F (36.9 C), resp. rate 16, height 5\' 2"  (1.575 m), weight 122 lb 6.4 oz (55.52 kg), last menstrual period 04/29/2012.  Body mass index is 22.39 kg/(m^2).  Gen: WDWN white female NAD  Neurological: Alert and oriented to person, place, and time. Motor and sensory function is grossly intact  Head: Normocephalic and atraumatic.  Eyes: Conjunctivae are normal. Pupils are equal, round, and reactive to light. No scleral icterus.  Neck: Normal range of motion. Neck supple. No tracheal deviation or thyromegaly present.  Cardiovascular: SR without murmurs or gallops. No carotid bruits  Respiratory: Effort normal. No respiratory distress. No chest wall tenderness. Breath sounds normal. No wheezes, rales or rhonchi.  Abdomen: nontender  GU: Rectal: Posterior column hemorrhoids. Tender area posteriorally likely a fissure  Musculoskeletal: Normal range of motion. Extremities are nontender. No cyanosis, edema or clubbing noted Lymphadenopathy: No cervical, preauricular, postauricular or axillary adenopathy is present Skin: Skin is warm and dry. No rash noted. No diaphoresis. No erythema. No pallor. Pscyh: Normal mood and affect. Behavior is normal. Judgment and thought content normal.  LABORATORY RESULTS:  No results found for this or any previous visit (from the past 48 hour(s)).  RADIOLOGY RESULTS:  No results found.  Problem List:  There is no problem list on file for this patient.   Assessment & Plan:  Anal spasm, hemorrhoids and probable fissure. Plan EUA with lat  int sphincteroromy and hemorrhoidectomy  Matt B. Daphine Deutscher, MD, [facility]  [phone] beeper  [phone]

## 2012-05-19 NOTE — Op Note (Signed)
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  General in lithotomy.  Prepped with PCMX and draped.  Timeout.  EUA revealed anterior rectocele and scar from prior third degree birth lac.  Posterior columns, the middle of which was strawberry appearing.  These three areas were clamped with the Ballintine clamp and oversewn with 3-0 chromic with horizontal mattress and running and locking suture.  Two areas oversewn for oozing.  Perirectal infiltration with Exparel--40 cc diluted.  Betadine ointment massaged and hemostatic dressing inserted.    Matt B. Daphine Deutscher, MD, [facility], [place] [phone]

## 2012-05-20 ENCOUNTER — Encounter (HOSPITAL_BASED_OUTPATIENT_CLINIC_OR_DEPARTMENT_OTHER): Payer: Self-pay | Admitting: Surgery

## 2012-05-20 ENCOUNTER — Telehealth (INDEPENDENT_AMBULATORY_CARE_PROVIDER_SITE_OTHER): Payer: Self-pay | Admitting: General Surgery

## 2012-05-20 NOTE — Telephone Encounter (Signed)
Pt calling to report intense pain postop hemorrhoidectomy.  She is not getting relief with Percocet or Tramadol.  Pt had first sitz bath this morning, then stood in the shower for another 20 min.  Reassured pt and reminded her that the first few days after surgery are the worst.  Encouraged her to do more sitz baths and give herself time to heal.  Can try tucking an ice pack there for comfort, too.  Will ask Dr. Daphine Deutscher if he wants to alter pain meds and call back.

## 2012-05-21 ENCOUNTER — Telehealth (INDEPENDENT_AMBULATORY_CARE_PROVIDER_SITE_OTHER): Payer: Self-pay | Admitting: General Surgery

## 2012-05-21 NOTE — Telephone Encounter (Signed)
Called pt with response from Dr. Daphine Deutscher re: pain control post op.  Stated she can use the Cardizem cream as well.  She states she has not used it since surgery and will add this to her regimen.

## 2012-05-24 ENCOUNTER — Telehealth (INDEPENDENT_AMBULATORY_CARE_PROVIDER_SITE_OTHER): Payer: Self-pay | Admitting: Surgery

## 2012-05-24 NOTE — Telephone Encounter (Signed)
S/p anal surgery last week Only 2 percocet left.  Old tramadol Rx not working.  Using sitz baths Called on Labor Day for refill Cannot call in oxycodone, so called in hydrocodone 7.5/500 #40 to [facility] per her request [phone]

## 2012-05-27 ENCOUNTER — Telehealth (INDEPENDENT_AMBULATORY_CARE_PROVIDER_SITE_OTHER): Payer: Self-pay

## 2012-05-27 NOTE — Telephone Encounter (Signed)
Pt calling in b/c she is having trouble with BM's since her hem sx done last wk. The pt has only had one small BM since her sx so she called the doc on call last night he advised pt to use Milk of Magnesia. The pt did one dose of the milk of magnesia at 5:30am taking 4 Tbls. With no relief. I advised pt that she could take another dose of the milk of magnesia now along with sitting in a warm sitz bath to help relax some of the muscles. I advised pt that she should really only drink liquids until she passes the BM. The pt understands.

## 2012-06-04 ENCOUNTER — Encounter (INDEPENDENT_AMBULATORY_CARE_PROVIDER_SITE_OTHER): Payer: Self-pay | Admitting: Surgery

## 2012-06-04 ENCOUNTER — Ambulatory Visit (INDEPENDENT_AMBULATORY_CARE_PROVIDER_SITE_OTHER): Payer: 59 | Admitting: Surgery

## 2012-06-04 VITALS — BP 102/66 | HR 72 | Temp 98.2°F | Resp 14 | Ht 62.0 in | Wt 120.6 lb

## 2012-06-04 DIAGNOSIS — K649 Unspecified hemorrhoids: Secondary | ICD-10-CM

## 2012-06-04 NOTE — Progress Notes (Signed)
Anna Hobbs 46 y.o.  Body mass index is 22.06 kg/(m^2).  Patient Active Problem List  Diagnosis  . Hemorrhoids  . Migraines    Allergies  Allergen Reactions  . Azithromycin     Unknown reaction  . Cefdinir Other (See Comments)    "caused c-diff"  . Metronidazole Hives    Past Surgical History  Procedure Date  . Cystectomy     cyst on neck  . Breast surgery     needle core bx-bilat-neg  . Laparoscopy     to r/o endometriosis  . Wisdom tooth extraction   . Hemorrhoid surgery     Hemorrhoidectomy   DEFAULT,PROVIDER, MD No diagnosis found.  She is postop hemorrhoidectomy.  She has had an expected degree of severe pain but is doing much better now.  Her pain began on PD 3 (Exparel).   Overall she is doing well and I will see her back in 6 weeks Anna B. Daphine Deutscher, MD, [facility] [phone] beeper [phone]  06/04/2012 6:14 PM

## 2012-06-04 NOTE — Patient Instructions (Signed)
Thanks for your patience.  If you need further assistance after leaving the office, please call our office and speak with a [facility] nurse.  [phone].  If you want to leave a message for Dr. Sheyanne Munley, please call his office phone at [phone]. 

## 2012-06-15 ENCOUNTER — Telehealth (INDEPENDENT_AMBULATORY_CARE_PROVIDER_SITE_OTHER): Payer: Self-pay | Admitting: General Surgery

## 2012-06-15 ENCOUNTER — Encounter (INDEPENDENT_AMBULATORY_CARE_PROVIDER_SITE_OTHER): Payer: Self-pay | Admitting: General Surgery

## 2012-06-15 NOTE — Telephone Encounter (Signed)
Pt calling for RTW note for Monday, 06/21/12.  Done and FAXd to [place] at [phone].

## 2012-07-09 ENCOUNTER — Telehealth (INDEPENDENT_AMBULATORY_CARE_PROVIDER_SITE_OTHER): Payer: Self-pay | Admitting: General Surgery

## 2012-07-09 NOTE — Telephone Encounter (Signed)
LMOM letting pt know I am rescheduling her appt from 10/30 to 11/1 at 9:40.  I also sent reminder card in the mail.

## 2012-07-21 ENCOUNTER — Encounter (INDEPENDENT_AMBULATORY_CARE_PROVIDER_SITE_OTHER): Payer: 59 | Admitting: Surgery

## 2012-07-23 ENCOUNTER — Encounter (INDEPENDENT_AMBULATORY_CARE_PROVIDER_SITE_OTHER): Payer: Self-pay | Admitting: Surgery

## 2012-07-23 ENCOUNTER — Ambulatory Visit (INDEPENDENT_AMBULATORY_CARE_PROVIDER_SITE_OTHER): Payer: 59 | Admitting: Surgery

## 2012-07-23 VITALS — BP 100/70 | HR 74 | Temp 98.1°F | Ht 62.5 in | Wt 122.6 lb

## 2012-07-23 DIAGNOSIS — Z9889 Other specified postprocedural states: Secondary | ICD-10-CM

## 2012-07-23 DIAGNOSIS — Z8719 Personal history of other diseases of the digestive system: Secondary | ICD-10-CM

## 2012-07-23 NOTE — Patient Instructions (Signed)
Hemorrhoidectomy Care After Hemorrhoidectomy is the removal of enlarged (dilated) veins around the rectum. Until the surgical areas are healed, control of pain and avoiding constipation are the greatest challenges for patients.  For as long as 24 hours after receiving an anesthetic (the medication that made you sleep), and while taking narcotic pain relievers, you may feel dizzy, weak and drowsy. For that reason, the following information applies to the first 24-hour period following surgery, and continues for as long as you are taking narcotic pain medications.  Do not drive a car, ride a bicycle, participate in activities in which you could be hurt. Do not take public transportation until you are off narcotic pain medications and until your caregiver says it is okay.  Do not drink alcohol, take tranquilizers, or medications not prescribed or allowed by your surgical caregiver.  Do not sign important papers or contracts for at least 24 hours or while taking narcotic medications.  Have a responsible person with you for 24 hours. RISKS AND COMPLICATIONS Some problems that may occur following this procedure include:  Infection. A germ starts growing in the tissue surrounding the site operated on. This can usually be treated with antibiotics.  Damage to the rectal sphincter could occur. This is the muscle that opens in your anus to allow a bowel movement. This could cause incontinence. This is uncommon.  Bleeding following surgery can be a complication of almost any surgery. Your surgeon takes every precaution to keep this from happening.  Complications of anesthesia. HOME CARE INSTRUCTIONS  Avoid straining when having bowel movements.  Avoid heavy lifting (more than 10 pounds (4.5 kilograms)).  Only take over-the-counter or prescription medicines for pain, discomfort, or fever as directed by your caregiver.  Take hot sitz baths for 20 to 30 minutes, 3 to 4 times per day.  To keep  swelling down, apply an ice pack for twenty minutes three to four times per day between sitz baths. Use a towel between your skin and the ice pack. Do not do this if it causes too much discomfort.  Keep anal area clean and dry. Following a bowel movement, you can gently wash the area with tucks (available for purchase at a drugstore) or cotton swabs. Gently pat the area dry. Do not rub the area.  Eat a well balanced diet and drink 6 to 8 glasses of water every day to avoid constipation. A bulk laxative may be also be helpful. SEEK MEDICAL CARE IF:   You have increasing pain or tenderness near or in the surgical site.  You are unable to eat or drink.  You develop nausea or vomiting.  You develop uncontrolled bleeding such as soaking two to three pads in one hour.  You have constipation, not helped by changing your diet or increasing your fluid intake. Pain medications are a common cause of constipation.  You have pain and redness (inflammation) extending outside the area of your surgery.  You develop an unexplained oral temperature above 102 F (38.9 C), or any other signs of infection.  You have any other questions or concerns following surgery. Document Released: 11/29/2003 Document Revised: 12/01/2011 Document Reviewed: 02/26/2009 ExitCare Patient Information 2013 [employer].  

## 2012-07-23 NOTE — Progress Notes (Signed)
Gwendolen Tijerina Tabor 46 y.o.  Body mass index is 22.07 kg/(m^2).  Patient Active Problem List  Diagnosis  . Hemorrhoids  . Migraines    Allergies  Allergen Reactions  . Azithromycin     Unknown reaction  . Cefdinir Other (See Comments)    "caused c-diff"  . Metronidazole Hives    Past Surgical History  Procedure Date  . Cystectomy     cyst on neck  . Breast surgery     needle core bx-bilat-neg  . Laparoscopy     to r/o endometriosis  . Wisdom tooth extraction   . Hemorrhoid surgery     Hemorrhoidectomy   DEFAULT,PROVIDER, MD No diagnosis found.  Has a few skin tags but not too bad.  Advised to use TUCKS.  All other problems resolved. Return prn Matt B. Daphine Deutscher, MD, [facility] [phone] beeper [phone]  07/23/2012 10:08 AM

## 2014-09-07 ENCOUNTER — Other Ambulatory Visit: Payer: Self-pay

## 2014-10-06 ENCOUNTER — Other Ambulatory Visit: Payer: Self-pay | Admitting: Physician Assistant

## 2016-04-19 ENCOUNTER — Encounter (HOSPITAL_COMMUNITY): Payer: Self-pay | Admitting: *Deleted

## 2016-04-19 ENCOUNTER — Emergency Department (HOSPITAL_COMMUNITY)

## 2016-04-19 ENCOUNTER — Emergency Department (HOSPITAL_COMMUNITY)
Admission: EM | Admit: 2016-04-19 | Discharge: 2016-04-19 | Disposition: A | Discharge status: Home / Self Care | Attending: Emergency Medicine | Admitting: Emergency Medicine

## 2016-04-19 DIAGNOSIS — M542 Cervicalgia: Secondary | ICD-10-CM | POA: Diagnosis present

## 2016-04-19 DIAGNOSIS — Y92410 Unspecified street and highway as the place of occurrence of the external cause: Secondary | ICD-10-CM | POA: Diagnosis not present

## 2016-04-19 DIAGNOSIS — M25512 Pain in left shoulder: Secondary | ICD-10-CM | POA: Insufficient documentation

## 2016-04-19 DIAGNOSIS — R51 Headache: Secondary | ICD-10-CM | POA: Diagnosis not present

## 2016-04-19 DIAGNOSIS — R079 Chest pain, unspecified: Secondary | ICD-10-CM | POA: Diagnosis not present

## 2016-04-19 DIAGNOSIS — Z79899 Other long term (current) drug therapy: Secondary | ICD-10-CM | POA: Insufficient documentation

## 2016-04-19 DIAGNOSIS — Y999 Unspecified external cause status: Secondary | ICD-10-CM | POA: Insufficient documentation

## 2016-04-19 DIAGNOSIS — Y9389 Activity, other specified: Secondary | ICD-10-CM | POA: Diagnosis not present

## 2016-04-19 DIAGNOSIS — M791 Myalgia: Secondary | ICD-10-CM | POA: Insufficient documentation

## 2016-04-19 MED ORDER — IBUPROFEN 200 MG PO TABS
600.0000 mg | ORAL_TABLET | Freq: Once | ORAL | Status: AC
  Administered 2016-04-19: 600 mg via ORAL
  Filled 2016-04-19: qty 3

## 2016-04-19 MED ORDER — METHOCARBAMOL 500 MG PO TABS
500.0000 mg | ORAL_TABLET | Freq: Every evening | ORAL | 0 refills | Status: DC | PRN
Start: 2016-04-19 — End: 2018-07-26

## 2016-04-19 MED ORDER — HYDROCODONE-ACETAMINOPHEN 5-325 MG PO TABS: 2.0000 | ORAL_TABLET | ORAL | 0 refills | Status: DC | PRN
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
## 2016-06-06 ENCOUNTER — Ambulatory Visit: Attending: Family Medicine | Admitting: Rehabilitative and Restorative Service Providers"

## 2016-06-06 DIAGNOSIS — M6281 Muscle weakness (generalized): Secondary | ICD-10-CM | POA: Diagnosis present

## 2016-06-06 DIAGNOSIS — M79602 Pain in left arm: Secondary | ICD-10-CM

## 2016-06-06 DIAGNOSIS — M542 Cervicalgia: Secondary | ICD-10-CM | POA: Diagnosis present

## 2016-06-06 NOTE — Patient Instructions (Signed)
HEP issued: 3x per day: pec corner/doorway stretch with elbows and hands flush against wall x 30 sec 2 reps; scap retract x 10; L Levator stretch with hands in lap 2x/day. Advised pt that none should increase sxs. Ice 10 min as needed. Discussed dry needling benefits. No painful activities to be performed outside of therapy
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
## 2016-06-10 ENCOUNTER — Ambulatory Visit: Admitting: Physical Therapy

## 2016-06-10 DIAGNOSIS — M79602 Pain in left arm: Secondary | ICD-10-CM

## 2016-06-10 DIAGNOSIS — M6281 Muscle weakness (generalized): Secondary | ICD-10-CM

## 2016-06-10 DIAGNOSIS — M542 Cervicalgia: Secondary | ICD-10-CM

## 2016-06-10 NOTE — Therapy (Signed)
[place], [place], [address] Phone: [phone]   Fax:  [phone]  Physical Therapy Treatment  Patient Details  Name: Anna Hobbs MRN: [id number] Date of Birth: Oct 15, 1965 Referring Provider: Ledora Bottcher  Encounter Date: 06/10/2016      PT End of Session - 06/10/16 1345    Visit Number 2   Number of Visits 18   Date for PT Re-Evaluation 07/18/16   PT Start Time 0848   PT Stop Time 0945   PT Time Calculation (min) 57 min   Behavior During Therapy [facility] for tasks assessed/performed      Past Medical History:  Diagnosis Date  . Depression   . Dysrhythmia    hx PVC  . Hemorrhoids   . IBS (irritable bowel syndrome)    ruling out  . Migraine     Past Surgical History:  Procedure Laterality Date  . BREAST SURGERY     needle core bx-bilat-neg  . CYSTECTOMY     cyst on neck  . HEMORRHOID SURGERY     Hemorrhoidectomy  . LAPAROSCOPY     to r/o endometriosis  . WISDOM TOOTH EXTRACTION      There were no vitals filed for this visit.      Subjective Assessment - 06/10/16 1338    Subjective 5/10,  has pain into shoulder.  doing exercises.  pain is about the same.   Currently in Pain? Yes   Pain Score 5    Pain Location Neck   Pain Orientation Left   Pain Descriptors / Indicators Burning;Aching;Spasm;Shooting   Pain Radiating Towards hand   Pain Frequency Constant   Aggravating Factors  yawning, driving, stretches,     Pain Relieving Factors medication   Effect of Pain on Daily Activities unable to sleep on stomach, extra time for ADL's                         [facility] Adult PT Treatment/Exercise - 06/10/16 0001      Self-Care   Self-Care --  sleeping posture with neck roll practiced,  upper trap relax     Shoulder Exercises: Stretch   Corner Stretch Limitations painful,  showed how to do single arm to decrease pain.      Traction   Type of Traction Cervical    Min (lbs) 5   Max (lbs) 12   Hold Time 20   Rest Time 5   Time 15     Manual Therapy   Manual therapy comments instrument assist, myofascial work, trigger point release upper traps                PT Education - 06/10/16 1344    Education provided Yes   Education Details posture, self care   Person(s) Educated Patient   Methods Explanation;Demonstration;Verbal cues   Comprehension Verbalized understanding;Returned demonstration          PT Short Term Goals - 06/10/16 1349      PT SHORT TERM GOAL #1   Title pt will be I with initial HEP   Baseline minor cues to avoid pain   Time 3   Period Weeks   Status On-going     PT SHORT TERM GOAL #2   Title pt will report decreased L UE radicular sxs with work duties x 25%   Baseline vary from shoulder to hand, constant   Time 3   Period Weeks   Status  On-going     PT SHORT TERM GOAL #3   Title pt will report decreased suboccipital tension headaches x 25%   Baseline unchanged   Time 3   Period Weeks   Status On-going     PT SHORT TERM GOAL #4   Title pt will report being able to hold her phone with 5/10 shoulder pain L   Time 3   Period Weeks   Status Unable to assess           PT Long Term Goals - 06/06/16 1107      PT LONG TERM GOAL #1   Title pt will be able to perform her work duties without L UE radicular sxs >/= 75% of the time   Baseline unable   Time 6   Period Weeks   Status New     PT LONG TERM GOAL #2   Title pt will be able to hold a grocery bag with her L hand with </= 3/10 L shoulder/cervical pain   Baseline 8/10   Time 6   Period Weeks   Status New     PT LONG TERM GOAL #3   Title pt will be able to position her purse strap across her L clavicle without pain   Baseline 6/10   Time 6   Period Weeks   Status New     PT LONG TERM GOAL #4   Title Pt will be able to sleep in L sidelying with 75% less pain   Baseline unable   Time 6   Period Weeks   Status New                Plan - 06/10/16 1345    Clinical Impression Statement Pain decreased to 4/10 post manual.  "More relaxed" .  trial of traction.No new goals met.  Upper trap relaxed with passive stretch of shoulder posterior to aligh with ear.    PT Next Visit Plan review HEP,  manual therapy to L pec/Upper Trap/1st rib mobs/scalenes/SCM, [place], assess traction   PT Home Exercise Plan single arm VS corner stretch   Consulted and Agree with Plan of Care Patient      Patient will benefit from skilled therapeutic intervention in order to improve the following deficits and impairments:  Decreased activity tolerance, Decreased mobility, Decreased range of motion, Decreased strength, Hypomobility, Increased muscle spasms, Impaired flexibility, Impaired sensation, Impaired UE functional use  Visit Diagnosis: Cervicalgia  Pain In Left Arm  Muscle weakness (generalized)     Problem List Patient Active Problem List   Diagnosis Date Noted  . S/P hemorrhoidectomy 07/23/2012  . Hemorrhoids 05/14/2012  . Migraines 05/14/2012    HARRIS,Xin PTA 06/10/2016, 1:52 PM  [facility] [address] [place], [place], [address] Phone: [phone]   Fax:  [phone]  Name: Anna Hobbs MRN: [id number] Date of Birth: 21-Oct-1965

## 2016-06-13 ENCOUNTER — Ambulatory Visit: Admitting: Physical Therapy

## 2016-06-13 DIAGNOSIS — M542 Cervicalgia: Secondary | ICD-10-CM

## 2016-06-13 DIAGNOSIS — M79602 Pain in left arm: Secondary | ICD-10-CM

## 2016-06-13 DIAGNOSIS — M6281 Muscle weakness (generalized): Secondary | ICD-10-CM

## 2016-06-13 NOTE — Therapy (Signed)
[facility] [address] [place], [place], [address] Phone: [phone]   Fax:  [phone]  Physical Therapy Treatment  Patient Details  Name: Anna Hobbs MRN: [id number] Date of Birth: Jan 28, 1966 Referring Provider: Delano Metzorrington, Kip  Encounter Date: 06/13/2016      PT End of Session - 06/13/16 1004    Visit Number 3   Number of Visits 18   Date for PT Re-Evaluation 07/18/16   PT Start Time 0801   PT Stop Time 0853   PT Time Calculation (min) 52 min   Activity Tolerance Patient tolerated treatment well   Behavior [facility] for tasks assessed/performed      Past Medical History:  Diagnosis Date  . Depression   . Dysrhythmia    hx PVC  . Hemorrhoids   . IBS (irritable bowel syndrome)    ruling out  . Migraine     Past Surgical History:  Procedure Laterality Date  . BREAST SURGERY     needle core bx-bilat-neg  . CYSTECTOMY     cyst on neck  . HEMORRHOID SURGERY     Hemorrhoidectomy  . LAPAROSCOPY     to r/o endometriosis  . WISDOM TOOTH EXTRACTION      There were no vitals filed for this visit.      Subjective Assessment - 06/13/16 0802    Subjective "still having pain in the neck mostly around the spine, and tightness in the shoulder" prolonged position at work cause it to be worse especially down back of the neck.    Currently in Pain? Yes   Pain Score 4    Pain Location Neck   Pain Orientation Left                         [facility] Adult PT Treatment/Exercise - 06/13/16 0001      Modalities   Modalities Moist Heat     Moist Heat Therapy   Number Minutes Moist Heat 10 Minutes   Moist Heat Location Cervical  pt in supine     Manual Therapy   Manual Therapy Soft tissue mobilization;Myofascial release;Taping;Joint mobilization   Manual therapy comments trialed manual trigger point release x 1 in l subclavius which pt reported increase in sx in shoulder  prior to TPDN    Joint Mobilization Grade 3 T1-T7 P>A mobs   Soft tissue mobilization IASTM over L upper trap/ levator scapulase   Myofascial Release DTM and fascial stretching/ rolling over L upper trap/ levator scapulae   McConnell L upper trap inhibition taping  trial                PT Education - 06/13/16 0930    Education provided Yes   Education Details anatomy of the muscles regarding trigger point formation, benefits of DN and mechanical effects, what to expect and after care. inhibition taping beneifts and what to expect and how long to wear it.    Person(s) Educated Patient   Methods Explanation;Verbal cues;Handout   Comprehension Verbalized understanding;Verbal cues required          PT Short Term Goals - 06/10/16 1349      PT SHORT TERM GOAL #1   Title pt will be I with initial HEP   Baseline minor cues to avoid pain   Time 3   Period Weeks   Status On-going     PT SHORT TERM GOAL #2   Title pt will report decreased  L UE radicular sxs with work duties x 25%   Baseline vary from shoulder to hand, constant   Time 3   Period Weeks   Status On-going     PT SHORT TERM GOAL #3   Title pt will report decreased suboccipital tension headaches x 25%   Baseline unchanged   Time 3   Period Weeks   Status On-going     PT SHORT TERM GOAL #4   Title pt will report being able to hold her phone with 5/10 shoulder pain L   Time 3   Period Weeks   Status Unable to assess           PT Long Term Goals - 06/06/16 1107      PT LONG TERM GOAL #1   Title pt will be able to perform her work duties without L UE radicular sxs >/= 75% of the time   Baseline unable   Time 6   Period Weeks   Status New     PT LONG TERM GOAL #2   Title pt will be able to hold a grocery bag with her L hand with </= 3/10 L shoulder/cervical pain   Baseline 8/10   Time 6   Period Weeks   Status New     PT LONG TERM GOAL #3   Title pt will be able to position her purse strap across her L  clavicle without pain   Baseline 6/10   Time 6   Period Weeks   Status New     PT LONG TERM GOAL #4   Title Pt will be able to sleep in L sidelying with 75% less pain   Baseline unable   Time 6   Period Weeks   Status New               Plan - 06/13/16 1016    Clinical Impression Statement Mrs. Anna Hobbs reports 4/10 pain, but continues to have radiateing L arm pain. follwing TPDN of the L upper trap and subcalvisu pt reported decreased pain in the L arm. following soft tissue work and inhibtion taping pain was 2/10. utilized MHP post session to calm down soreness from DN.    PT Next Visit Plan assess response to DN,  manual therapy to L pec/Upper Trap/1st rib mobs/scalenes/SCM, [place], assess traction   Consulted and Agree with Plan of Care Patient      Patient will benefit from skilled therapeutic intervention in order to improve the following deficits and impairments:  Decreased activity tolerance, Decreased mobility, Decreased range of motion, Decreased strength, Hypomobility, Increased muscle spasms, Impaired flexibility, Impaired sensation, Impaired UE functional use  Visit Diagnosis: Cervicalgia  Pain In Left Arm  Muscle weakness (generalized)     Problem List Patient Active Problem List   Diagnosis Date Noted  . S/P hemorrhoidectomy 07/23/2012  . Hemorrhoids 05/14/2012  . Migraines 05/14/2012   Anna Hobbs PT, DPT, LAT, ATC  06/13/16  10:27 AM      [facility] [address] [place], [place], [address] Phone: [phone]   Fax:  [phone]  Name: Anna Hobbs MRN: [id number] Date of Birth: 23-Nov-1965

## 2016-06-16 ENCOUNTER — Ambulatory Visit: Admitting: Physical Therapy

## 2016-06-18 ENCOUNTER — Ambulatory Visit: Admitting: Physical Therapy

## 2016-06-18 DIAGNOSIS — M79602 Pain in left arm: Secondary | ICD-10-CM

## 2016-06-18 DIAGNOSIS — M542 Cervicalgia: Secondary | ICD-10-CM

## 2016-06-18 DIAGNOSIS — M6281 Muscle weakness (generalized): Secondary | ICD-10-CM

## 2016-06-18 NOTE — Therapy (Signed)
[facility] Outpatient Rehabilitation [facility] [address] [place], [place], [address] Phone: [phone]   Fax:  [phone]  Physical Therapy Treatment  Patient Details  Name: Anna Hobbs MRN: [id number] Date of Birth: [birth year] Referring Provider: Delano Metz  Encounter Date: 06/18/2016      PT End of Session - 06/18/16 0928    Visit Number 3   Number of Visits 18   Date for PT Re-Evaluation 07/18/16   PT Start Time 0801   PT Stop Time 0846   PT Time Calculation (min) 45 min   Activity Tolerance Patient tolerated treatment well   Behavior During Therapy [facility] for tasks assessed/performed      Past Medical History:  Diagnosis Date  . Depression   . Dysrhythmia    hx PVC  . Hemorrhoids   . IBS (irritable bowel syndrome)    ruling out  . Migraine     Past Surgical History:  Procedure Laterality Date  . BREAST SURGERY     needle core bx-bilat-neg  . CYSTECTOMY     cyst on neck  . HEMORRHOID SURGERY     Hemorrhoidectomy  . LAPAROSCOPY     to r/o endometriosis  . WISDOM TOOTH EXTRACTION      There were no vitals filed for this visit.      Subjective Assessment - 06/18/16 0803    Subjective DN was good.  Arm pain intermittant with lifting arm overhead,  goes to elbow vs hand now.   Currently in Pain? Yes   Pain Score --  mild   Pain Location Neck   Pain Orientation Left   Pain Descriptors / Indicators Burning;Tingling;Tightness;Numbness   Pain Radiating Towards medila elbow   Pain Frequency Constant   Aggravating Factors  reaching overhead, Yawning,  sitting longer   Pain Relieving Factors DN,  getting up moving around   Effect of Pain on Daily Activities limits overhead reaching. Limits how long she can sit                         [facility] Adult PT Treatment/Exercise - 06/18/16 0001      Self-Care   Self-Care Posture;Other Self-Care Comments  Discussed activities, should stop with increased armpain   Other  Self-Care Comments  If looking up make it brief then return head to neutral.       Modalities   Modalities --  declined     Ultrasound   Ultrasound Location Left neck, upper trap   Ultrasound Parameters 100% 1.5 watts /cm2,  8 minutes   Ultrasound Goals Pain  to prepare tissue for massage     Manual Therapy   Manual Therapy Soft tissue mobilization   Manual therapy comments tissue softened. she had intermittant arm/hand numbness with scalenes  Pecs, levator, upper trap, scalenes, SCM etc supine, side   Joint Mobilization Grade 2 1st rib depression, light due to discomfort                  PT Short Term Goals - 06/18/16 0934      PT SHORT TERM GOAL #1   Title pt will be I with initial HEP   Time 3   Period Weeks   Status On-going     PT SHORT TERM GOAL #2   Title pt will report decreased L UE radicular sxs with work duties x 25%   Baseline radiate to elbow , intermittant now   Time 3  Period Weeks   Status On-going     PT SHORT TERM GOAL #3   Title pt will report decreased suboccipital tension headaches x 25%   Time 3   Period Weeks   Status Unable to assess     PT SHORT TERM GOAL #4   Title pt will report being able to hold her phone with 5/10 shoulder pain L   Time 3   Period Weeks   Status Unable to assess           PT Long Term Goals - 06/06/16 1107      PT LONG TERM GOAL #1   Title pt will be able to perform her work duties without L UE radicular sxs >/= 75% of the time   Baseline unable   Time 6   Period Weeks   Status New     PT LONG TERM GOAL #2   Title pt will be able to hold a grocery bag with her L hand with </= 3/10 L shoulder/cervical pain   Baseline 8/10   Time 6   Period Weeks   Status New     PT LONG TERM GOAL #3   Title pt will be able to position her purse strap across her L clavicle without pain   Baseline 6/10   Time 6   Period Weeks   Status New     PT LONG TERM GOAL #4   Title Pt will be able to sleep in L  sidelying with 75% less pain   Baseline unable   Time 6   Period Weeks   Status New               Plan - 06/18/16 [id number]    Clinical Impression Statement Focus today on soft tissue work/ modalities. Upper trap was still tense however she felt she could move her neck easier. Posture improved post session in sitting/standing   PT Next Visit Plan Manual,    PT Home Exercise Plan continue   Consulted and Agree with Plan of Care Patient      Patient will benefit from skilled therapeutic intervention in order to improve the following deficits and impairments:  Decreased activity tolerance, Decreased mobility, Decreased range of motion, Decreased strength, Hypomobility, Increased muscle spasms, Impaired flexibility, Impaired sensation, Impaired UE functional use  Visit Diagnosis: Cervicalgia  Pain In Left Arm  Muscle weakness (generalized)     Problem List Patient Active Problem List   Diagnosis Date Noted  . S/P hemorrhoidectomy 07/23/2012  . Hemorrhoids 05/14/2012  . Migraines 05/14/2012    HARRIS,Lizzette PTA 06/18/2016, 9:37 AM  [facility] [address] [place], [place], [address] Phone: [phone]   Fax:  [phone]  Name: Anna Hobbs MRN: [id number] Date of Birth: Mar 30, 1966

## 2016-06-20 ENCOUNTER — Ambulatory Visit: Admitting: Physical Therapy

## 2016-06-20 DIAGNOSIS — M542 Cervicalgia: Secondary | ICD-10-CM | POA: Diagnosis not present

## 2016-06-20 DIAGNOSIS — M6281 Muscle weakness (generalized): Secondary | ICD-10-CM

## 2016-06-20 DIAGNOSIS — M79602 Pain in left arm: Secondary | ICD-10-CM

## 2016-06-20 NOTE — Therapy (Signed)
[facility] [address] [place], [place], [address] Phone: [phone]   Fax:  [phone]  Physical Therapy Treatment  Patient Details  Name: Anna FennelKaren A Zielinski MRN: [id number] Date of Birth: [birth year] Referring Provider: Delano Metzorrington, Kip  Encounter Date: 06/20/2016      PT End of Session - 06/20/16 0909    Visit Number 5   Number of Visits 18   Date for PT Re-Evaluation 07/18/16   PT Start Time 0801   PT Stop Time 0850   PT Time Calculation (min) 49 min   Activity Tolerance Patient tolerated treatment well   Behavior During Therapy [facility] for tasks assessed/performed      Past Medical History:  Diagnosis Date  . Depression   . Dysrhythmia    hx PVC  . Hemorrhoids   . IBS (irritable bowel syndrome)    ruling out  . Migraine     Past Surgical History:  Procedure Laterality Date  . BREAST SURGERY     needle core bx-bilat-neg  . CYSTECTOMY     cyst on neck  . HEMORRHOID SURGERY     Hemorrhoidectomy  . LAPAROSCOPY     to r/o endometriosis  . WISDOM TOOTH EXTRACTION      There were no vitals filed for this visit.      Subjective Assessment - 06/20/16 0757    Subjective "I can feel like it is loosening up some, was at the grocery store and tried carrying then I am used more than I am able to"   Currently in Pain? Yes   Pain Score 5    Pain Orientation Left   Pain Descriptors / Indicators Aching   Pain Type Chronic pain   Pain Onset More than a month ago   Pain Frequency Constant                         [facility] Adult PT Treatment/Exercise - 06/20/16 0001      Moist Heat Therapy   Number Minutes Moist Heat 10 Minutes   Moist Heat Location Cervical     Manual Therapy   Manual Therapy Neural Stretch   Joint Mobilization grade 2-3 1st rib mobs with pt inhaling/ exhaling for assisted mobilization.    Soft tissue mobilization IASTM over the L scalene upper trap and levator scapulae   Neural  Stretch median nerve glides   given as HEP     Neck Exercises: Stretches   Upper Trapezius Stretch 2 reps;30 seconds   Levator Stretch 2 reps;30 seconds   Other Neck Stretches scalene 2 x 30           Trigger Point Dry Needling - 06/20/16 0809    Consent Given? Yes   Education Handout Provided Yes  given previously   Muscles Treated Upper Body Upper trapezius;Longissimus  scalenes on L x 1 with pistoning / twisting   Upper Trapezius Response Twitch reponse elicited;Palpable increased muscle length  x2 with pistoning   Longissimus Response Twitch response elicited;Palpable increased muscle length  x 2 1 at T1, 1 at C6 with pistoning/ twisting              PT Education - 06/20/16 0909    Education provided Yes   Education Details reviewed and updated HEP for median nerve glides   Person(s) Educated Patient   Methods Explanation;Verbal cues;Handout   Comprehension Verbalized understanding;Verbal cues required          PT  Short Term Goals - 06/18/16 0934      PT SHORT TERM GOAL #1   Title pt will be I with initial HEP   Time 3   Period Weeks   Status On-going     PT SHORT TERM GOAL #2   Title pt will report decreased L UE radicular sxs with work duties x 25%   Baseline radiate to elbow , intermittant now   Time 3   Period Weeks   Status On-going     PT SHORT TERM GOAL #3   Title pt will report decreased suboccipital tension headaches x 25%   Time 3   Period Weeks   Status Unable to assess     PT SHORT TERM GOAL #4   Title pt will report being able to hold her phone with 5/10 shoulder pain L   Time 3   Period Weeks   Status Unable to assess           PT Long Term Goals - 06/06/16 1107      PT LONG TERM GOAL #1   Title pt will be able to perform her work duties without L UE radicular sxs >/= 75% of the time   Baseline unable   Time 6   Period Weeks   Status New     PT LONG TERM GOAL #2   Title pt will be able to hold a grocery bag with her  L hand with </= 3/10 L shoulder/cervical pain   Baseline 8/10   Time 6   Period Weeks   Status New     PT LONG TERM GOAL #3   Title pt will be able to position her purse strap across her L clavicle without pain   Baseline 6/10   Time 6   Period Weeks   Status New     PT LONG TERM GOAL #4   Title Pt will be able to sleep in L sidelying with 75% less pain   Baseline unable   Time 6   Period Weeks   Status New               Plan - 06/20/16 0910    Clinical Impression Statement continued to focus on manual to decreased tightness ofthe scalenes, upper trap and T1 and C6 multifidus with DN. followed with soft tissue work and 1st rib mobs. performed median nerve glides which she rpeorted releif of pain and was given as HEP. utilized MHP post session for DN soreness, pt reported pain dropped to 2/10.    PT Next Visit Plan assess response to DN, 1st rib mobs, upper trap inhibition taping PRN, thoracic mobility exercises, modalities PRN,    PT Home Exercise Plan median nerve glides      Patient will benefit from skilled therapeutic intervention in order to improve the following deficits and impairments:  Decreased activity tolerance, Decreased mobility, Decreased range of motion, Decreased strength, Hypomobility, Increased muscle spasms, Impaired flexibility, Impaired sensation, Impaired UE functional use  Visit Diagnosis: Cervicalgia  Pain In Left Arm  Muscle weakness (generalized)     Problem List Patient Active Problem List   Diagnosis Date Noted  . S/P hemorrhoidectomy 07/23/2012  . Hemorrhoids 05/14/2012  . Migraines 05/14/2012   Lulu Riding PT, DPT, LAT, ATC  06/20/16  9:19 AM      [facility] [address] [place], [place], [address] Phone: [phone]   Fax:  [phone]  Name: SHATONIA HOOTS MRN:  [id number] Date of Birth: 05-Oct-1965

## 2016-06-23 ENCOUNTER — Ambulatory Visit: Attending: Family Medicine | Admitting: Physical Therapy

## 2016-06-23 DIAGNOSIS — M542 Cervicalgia: Secondary | ICD-10-CM | POA: Insufficient documentation

## 2016-06-23 DIAGNOSIS — M79602 Pain in left arm: Secondary | ICD-10-CM | POA: Diagnosis present

## 2016-06-23 DIAGNOSIS — M6281 Muscle weakness (generalized): Secondary | ICD-10-CM | POA: Diagnosis present

## 2016-06-23 NOTE — Therapy (Signed)
[facility] Out[facility] [address] [place], [place], [address] Phone: [phone]   Fax:  [phone]  Physical Therapy Treatment  Patient Details  Name: Anna Hobbs MRN: [id number] Date of Birth: 09/26/1965 Referring Provider: Delano Metz  Encounter Date: 06/23/2016      PT End of Session - 06/23/16 0849    Visit Number 6   Number of Visits 18   Date for PT Re-Evaluation 07/18/16   Authorization Type selfpay   PT Start Time 0801   PT Stop Time 0845   PT Time Calculation (min) 44 min   Activity Tolerance Patient tolerated treatment well   Behavior During Therapy [facility] for tasks assessed/performed      Past Medical History:  Diagnosis Date  . Depression   . Dysrhythmia    hx PVC  . Hemorrhoids   . IBS (irritable bowel syndrome)    ruling out  . Migraine     Past Surgical History:  Procedure Laterality Date  . BREAST SURGERY     needle core bx-bilat-neg  . CYSTECTOMY     cyst on neck  . HEMORRHOID SURGERY     Hemorrhoidectomy  . LAPAROSCOPY     to r/o endometriosis  . WISDOM TOOTH EXTRACTION      There were no vitals filed for this visit.      Subjective Assessment - 06/23/16 0800    Subjective " I spent a good deal amoutn of time driving, the arm is feeling better as far as tingling the neck was bothering me alittle more'   Currently in Pain? Yes   Pain Score 4    Pain Location Neck   Pain Orientation Left   Pain Type Chronic pain   Pain Onset More than a month ago   Multiple Pain Sites Yes                         [facility] Adult PT Treatment/Exercise - 06/23/16 0001      Neck Exercises: Seated   Neck Retraction 10 reps;10 secs  verbal cues for form   Other Seated Exercise thoracic rotation 2 x 10 with arms acrossed chest  to isolate thoracic spine. given as HEP   Other Seated Exercise thoracic extension over chair 2 x 10 (to pick a spot on floor to keep chin tucked)  given as HEP     Neck Exercises: Supine   Neck Retraction 10 reps;5 secs  with towel beneath top of head to facilitate form     Manual Therapy   Manual therapy comments sub-occipital release   Joint Mobilization C3-C7 central P>A grade 3 mobs, and grade 2 L gapping mobs,  grade 2 1st rib mobs on L      Neck Exercises: Stretches   Upper Trapezius Stretch 2 reps;30 seconds                PT Education - 06/23/16 0844    Education provided Yes   Education Details updated and reviewed HEP for thoracic mobility and chin tucks with proper form and treatment rationale.  mechanics of the sub-occipitals and causes of mechancis HA with prolong positions and benefits of stretching.    Person(s) Educated Patient   Methods Explanation;Demonstration;Verbal cues;Handout   Comprehension Verbalized understanding;Returned demonstration;Verbal cues required          PT Short Term Goals - 06/18/16 0934      PT SHORT TERM GOAL #1   Title pt  will be I with initial HEP   Time 3   Period Weeks   Status On-going     PT SHORT TERM GOAL #2   Title pt will report decreased L UE radicular sxs with work duties x 25%   Baseline radiate to elbow , intermittant now   Time 3   Period Weeks   Status On-going     PT SHORT TERM GOAL #3   Title pt will report decreased suboccipital tension headaches x 25%   Time 3   Period Weeks   Status Unable to assess     PT SHORT TERM GOAL #4   Title pt will report being able to hold her phone with 5/10 shoulder pain L   Time 3   Period Weeks   Status Unable to assess           PT Long Term Goals - 06/06/16 1107      PT LONG TERM GOAL #1   Title pt will be able to perform her work duties without L UE radicular sxs >/= 75% of the time   Baseline unable   Time 6   Period Weeks   Status New     PT LONG TERM GOAL #2   Title pt will be able to hold a grocery bag with her L hand with </= 3/10 L shoulder/cervical pain   Baseline 8/10   Time 6   Period Weeks    Status New     PT LONG TERM GOAL #3   Title pt will be able to position her purse strap across her L clavicle without pain   Baseline 6/10   Time 6   Period Weeks   Status New     PT LONG TERM GOAL #4   Title Pt will be able to sleep in L sidelying with 75% less pain   Baseline unable   Time 6   Period Weeks   Status New               Plan - 06/23/16 0900    Clinical Impression Statement Mrs. Carolann Littlerthurbon reports no referred pain in the L hand, with soreness noted at the shoulder and cervgical spine. focused on throacic spine mobility and cervical mobs, and manual to calm down sub-occiptial tighness. pt declined modalities post session and reported pain dropped to 2/10 and felt looser.    PT Next Visit Plan DN PRN, 1st rib mobs, upper trap inhibition taping PRN, thoracic mobility exercises, modalities PRN, ROM, GOALS   PT Home Exercise Plan thoracic mobility, chin tucks in sitting/ supine,       Patient will benefit from skilled therapeutic intervention in order to improve the following deficits and impairments:  Decreased activity tolerance, Decreased mobility, Decreased range of motion, Decreased strength, Hypomobility, Increased muscle spasms, Impaired flexibility, Impaired sensation, Impaired UE functional use  Visit Diagnosis: Cervicalgia  Pain in left arm  Muscle weakness (generalized)     Problem List Patient Active Problem List   Diagnosis Date Noted  . S/P hemorrhoidectomy 07/23/2012  . Hemorrhoids 05/14/2012  . Migraines 05/14/2012   Lulu RidingKristoffer Donna Snooks PT, DPT, LAT, ATC  06/23/16  9:05 AM      [facility] [address] [place], [place], [address] Phone: [phone]   Fax:  [phone]  Name: Derenda FennelKaren A Babino MRN: [id number] Date of Birth: 02-Jun-1966

## 2016-06-25 ENCOUNTER — Encounter: Admitting: Physical Therapy

## 2016-06-25 ENCOUNTER — Ambulatory Visit: Admitting: Physical Therapy

## 2016-06-25 DIAGNOSIS — M542 Cervicalgia: Secondary | ICD-10-CM

## 2016-06-25 DIAGNOSIS — M6281 Muscle weakness (generalized): Secondary | ICD-10-CM

## 2016-06-25 DIAGNOSIS — M79602 Pain in left arm: Secondary | ICD-10-CM

## 2016-06-25 NOTE — Therapy (Signed)
[facility] Outpatient Rehabilitation [facility] [address] [place], [place], [address] Phone: [phone]   Fax:  [phone]  Physical Therapy Treatment  Patient Details  Name: ERMEL VERNE MRN: [id number] Date of Birth: March 31, 1966 Referring Provider: Delano Metz  Encounter Date: 06/25/2016      PT End of Session - 06/25/16 1723    Visit Number 7   Number of Visits 18   Date for PT Re-Evaluation 07/18/16   Authorization Type selfpay   PT Start Time 1546   PT Stop Time 1636   PT Time Calculation (min) 50 min   Activity Tolerance Patient tolerated treatment well   Behavior During Therapy [facility] for tasks assessed/performed      Past Medical History:  Diagnosis Date  . Depression   . Dysrhythmia    hx PVC  . Hemorrhoids   . IBS (irritable bowel syndrome)    ruling out  . Migraine     Past Surgical History:  Procedure Laterality Date  . BREAST SURGERY     needle core bx-bilat-neg  . CYSTECTOMY     cyst on neck  . HEMORRHOID SURGERY     Hemorrhoidectomy  . LAPAROSCOPY     to r/o endometriosis  . WISDOM TOOTH EXTRACTION      There were no vitals filed for this visit.      Subjective Assessment - 06/25/16 1549    Subjective "doing better today, working on the exercises, and every day seems to be a little better"    Currently in Pain? Yes   Pain Score 3    Pain Location Neck   Pain Orientation Left   Pain Descriptors / Indicators Aching   Pain Type Chronic pain   Pain Onset More than a month ago   Pain Frequency Constant   Aggravating Factors  tensing up the upper body,    Pain Relieving Factors DN, getting up moving around                         [facility] Adult PT Treatment/Exercise - 06/25/16 0001      Neck Exercises: Seated   Neck Retraction 10 reps;10 secs     Shoulder Exercises: ROM/Strengthening   UBE (Upper Arm Bike) L 1 x 4 min  changing direction at 4 min     Shoulder Exercises: Stretch   Corner  Stretch 30 seconds;4 reps  door way, 2 x horzontially, 2 x for lower pec stretch     Moist Heat Therapy   Number Minutes Moist Heat 10 Minutes   Moist Heat Location Cervical     Ultrasound   Ultrasound Location L upper trap   Ultrasound Parameters 100%, 1 mhz, 1.0 w/cm2 x  8 min   Ultrasound Goals Pain     Manual Therapy   Soft tissue mobilization IASTM over the L upper trap and levator scapulae   McConnell L upper trap inhibition taping     Neck Exercises: Stretches   Upper Trapezius Stretch 2 reps;30 seconds          Trigger Point Dry Needling - 06/25/16 1722    Consent Given? Yes   Education Handout Provided Yes  given previously   Upper Trapezius Response Twitch reponse elicited;Palpable increased muscle length  x 2 with pistoning                PT Short Term Goals - 06/18/16 0934      PT SHORT TERM GOAL #1  Title pt will be I with initial HEP   Time 3   Period Weeks   Status On-going     PT SHORT TERM GOAL #2   Title pt will report decreased L UE radicular sxs with work duties x 25%   Baseline radiate to elbow , intermittant now   Time 3   Period Weeks   Status On-going     PT SHORT TERM GOAL #3   Title pt will report decreased suboccipital tension headaches x 25%   Time 3   Period Weeks   Status Unable to assess     PT SHORT TERM GOAL #4   Title pt will report being able to hold her phone with 5/10 shoulder pain L   Time 3   Period Weeks   Status Unable to assess           PT Long Term Goals - 06/06/16 1107      PT LONG TERM GOAL #1   Title pt will be able to perform her work duties without L UE radicular sxs >/= 75% of the time   Baseline unable   Time 6   Period Weeks   Status New     PT LONG TERM GOAL #2   Title pt will be able to hold a grocery bag with her L hand with </= 3/10 L shoulder/cervical pain   Baseline 8/10   Time 6   Period Weeks   Status New     PT LONG TERM GOAL #3   Title pt will be able to position her  purse strap across her L clavicle without pain   Baseline 6/10   Time 6   Period Weeks   Status New     PT LONG TERM GOAL #4   Title Pt will be able to sleep in L sidelying with 75% less pain   Baseline unable   Time 6   Period Weeks   Status New               Plan - 06/25/16 1724    Clinical Impression Statement Mrs. Carolann Littlerthurbon continues to progress with PT with decrease for pain, and no tingling in the hand. continued DN ont he L upper trap, following with US and soft tissue she reported soreness in the shoulder from DN but had decreased tension.    PT Treatment/Interventions ADLs/Self Care Home Management;Cryotherapy;Electrical Stimulation;Iontophoresis 4mg /ml Dexamethasone;Moist Heat;Traction;Ultrasound;Functional mobility training;Patient/family education;Neuromuscular re-education;Therapeutic exercise;Therapeutic activities;Manual techniques;Passive range of motion;Taping;Dry needling   PT Next Visit Plan DN PRN, 1st rib mobs, upper trap inhibition taping PRN, thoracic mobility exercises, modalities PRN, ROM, GOALS   Consulted and Agree with Plan of Care Patient      Patient will benefit from skilled therapeutic intervention in order to improve the following deficits and impairments:  Decreased activity tolerance, Decreased mobility, Decreased range of motion, Decreased strength, Hypomobility, Increased muscle spasms, Impaired flexibility, Impaired sensation, Impaired UE functional use  Visit Diagnosis: Cervicalgia  Pain in left arm  Muscle weakness (generalized)     Problem List Patient Active Problem List   Diagnosis Date Noted  . S/P hemorrhoidectomy 07/23/2012  . Hemorrhoids 05/14/2012  . Migraines 05/14/2012   Lulu RidingKristoffer Ronnell Makarewicz PT, DPT, LAT, ATC  06/25/16  5:38 PM      [facility] [address] [place], [place], [address] Phone: [phone]   Fax:  [phone]  Name: Derenda FennelKaren A Liberatore MRN:  [id number] Date of Birth: [birth year]

## 2016-06-27 ENCOUNTER — Ambulatory Visit: Admitting: Physical Therapy

## 2016-06-27 DIAGNOSIS — M542 Cervicalgia: Secondary | ICD-10-CM | POA: Diagnosis not present

## 2016-06-27 DIAGNOSIS — M6281 Muscle weakness (generalized): Secondary | ICD-10-CM

## 2016-06-27 DIAGNOSIS — M79602 Pain in left arm: Secondary | ICD-10-CM

## 2016-06-27 NOTE — Therapy (Signed)
[facility] [address] [place], [place], [address] Phone: [phone]   Fax:  [phone]  Physical Therapy Treatment  Patient Details  Name: Derenda FennelKaren A Pattison MRN: [id number] Date of Birth: July 29, 1966 Referring Provider: Delano Metzorrington, Kip  Encounter Date: 06/27/2016      PT End of Session - 06/27/16 0952    Visit Number 8   Number of Visits 18   Date for PT Re-Evaluation 07/18/16   Authorization Type selfpay   PT Start Time 0800   PT Stop Time 0847   PT Time Calculation (min) 47 min   Activity Tolerance Patient tolerated treatment well   Behavior During Therapy [facility] for tasks assessed/performed      Past Medical History:  Diagnosis Date  . Depression   . Dysrhythmia    hx PVC  . Hemorrhoids   . IBS (irritable bowel syndrome)    ruling out  . Migraine     Past Surgical History:  Procedure Laterality Date  . BREAST SURGERY     needle core bx-bilat-neg  . CYSTECTOMY     cyst on neck  . HEMORRHOID SURGERY     Hemorrhoidectomy  . LAPAROSCOPY     to r/o endometriosis  . WISDOM TOOTH EXTRACTION      There were no vitals filed for this visit.      Subjective Assessment - 06/27/16 0757    Subjective "I was pretty sore from last session with the DN, do get some fleeting symptoms but it isn't"   Currently in Pain? Yes   Pain Score 1    Pain Location Neck   Pain Orientation Left   Pain Descriptors / Indicators Aching   Pain Type Chronic pain   Pain Onset More than a month ago   Pain Frequency Constant                         [facility] Adult PT Treatment/Exercise - 06/27/16 0805      Neck Exercises: Supine   Other Supine Exercise supine foam roll routine, with ceiling punches, alt ceiling punches, horizontal abd/ add, backstrock 1 x 10 each.    Other Supine Exercise scapular stabilizers abduction narrow grip 1 x 10 with flexion, wide grip 1 x 10,  PNF D2 1 x 10 on L      Shoulder Exercises:  ROM/Strengthening   UBE (Upper Arm Bike) L1 x 5 min  changning direction at 2:5 min     Manual Therapy   Manual therapy comments manual trigger point release x 3 in L upper trap   educated how perform at home with theracane   Joint Mobilization C3-C7 central P>A grade 3 mobs, and grade 2 L gapping mobs,  grade 2 1st rib mobs on L      Neck Exercises: Stretches   Upper Trapezius Stretch 3 reps;30 seconds                PT Education - 06/27/16 0847    Education provided Yes   Education Details updated and reviewed HEP for    Person(s) Educated Patient   Methods Explanation;Verbal cues;Handout;Demonstration   Comprehension Verbalized understanding;Verbal cues required;Returned demonstration          PT Short Term Goals - 06/27/16 0954      PT SHORT TERM GOAL #1   Title pt will be I with initial HEP   Time 3   Period Weeks   Status Achieved  PT Long Term Goals - 06/06/16 1107      PT LONG TERM GOAL #1   Title pt will be able to perform her work duties without L UE radicular sxs >/= 75% of the time   Baseline unable   Time 6   Period Weeks   Status New     PT LONG TERM GOAL #2   Title pt will be able to hold a grocery bag with her L hand with </= 3/10 L shoulder/cervical pain   Baseline 8/10   Time 6   Period Weeks   Status New     PT LONG TERM GOAL #3   Title pt will be able to position her purse strap across her L clavicle without pain   Baseline 6/10   Time 6   Period Weeks   Status New     PT LONG TERM GOAL #4   Title Pt will be able to sleep in L sidelying with 75% less pain   Baseline unable   Time 6   Period Weeks   Status New               Plan - 06/27/16 1610    Clinical Impression Statement pt continues to report relief of pain and improved mobility. following cerivcal/ L 1st rib mobs she reported relief of pain/ tightness. she was able to perform scapular stability exercises and thoracic mobility with minimal report  of pain, and declined modalities post session.    PT Next Visit Plan DN PRN, 1st rib mobs, upper trap inhibition taping PRN, thoracic mobility exercises, modalities PRN, ROM, GOALS   PT Home Exercise Plan PNF D2, horizontal abduction   Consulted and Agree with Plan of Care Patient      Patient will benefit from skilled therapeutic intervention in order to improve the following deficits and impairments:  Decreased activity tolerance, Decreased mobility, Decreased range of motion, Decreased strength, Hypomobility, Increased muscle spasms, Impaired flexibility, Impaired sensation, Impaired UE functional use  Visit Diagnosis: Cervicalgia  Pain in left arm  Muscle weakness (generalized)     Problem List Patient Active Problem List   Diagnosis Date Noted  . S/P hemorrhoidectomy 07/23/2012  . Hemorrhoids 05/14/2012  . Migraines 05/14/2012   Lulu Riding PT, DPT, LAT, ATC  06/27/16  9:56 AM      [facility] [address] [place], [place], [address] Phone: [phone]   Fax:  [phone]  Name: ROMIE KEEBLE MRN: [id number] Date of Birth: [birth year]

## 2016-06-30 ENCOUNTER — Ambulatory Visit: Admitting: Physical Therapy

## 2016-06-30 DIAGNOSIS — M79602 Pain in left arm: Secondary | ICD-10-CM

## 2016-06-30 DIAGNOSIS — M542 Cervicalgia: Secondary | ICD-10-CM | POA: Diagnosis not present

## 2016-06-30 DIAGNOSIS — M6281 Muscle weakness (generalized): Secondary | ICD-10-CM

## 2016-06-30 NOTE — Therapy (Signed)
[facility] [address] [place], [place], [address] Phone: [phone]   Fax:  [phone]  Physical Therapy Treatment  Patient Details  Name: Anna FennelKaren A Hobbs MRN: [id number] Date of Birth: 05-09-1966 Referring Provider: Delano Metzorrington, Kip  Encounter Date: 06/30/2016      PT End of Session - 06/30/16 0917    Visit Number 9   Number of Visits 18   Date for PT Re-Evaluation 07/18/16   PT Start Time 0802   PT Stop Time 0846   PT Time Calculation (min) 44 min   Activity Tolerance Patient tolerated treatment well   Behavior During Therapy [facility] for tasks assessed/performed      Past Medical History:  Diagnosis Date  . Depression   . Dysrhythmia    hx PVC  . Hemorrhoids   . IBS (irritable bowel syndrome)    ruling out  . Migraine     Past Surgical History:  Procedure Laterality Date  . BREAST SURGERY     needle core bx-bilat-neg  . CYSTECTOMY     cyst on neck  . HEMORRHOID SURGERY     Hemorrhoidectomy  . LAPAROSCOPY     to r/o endometriosis  . WISDOM TOOTH EXTRACTION      There were no vitals filed for this visit.      Subjective Assessment - 06/30/16 0805    Subjective "I am feeling alittle sore today in the L shoulder, which could be due to sleeping/ lying on by L side"   Currently in Pain? Yes   Pain Score 4    Pain Location Shoulder   Pain Orientation Left   Pain Descriptors / Indicators Aching   Pain Type Chronic pain   Pain Onset More than a month ago   Pain Frequency Intermittent                         [facility] Adult PT Treatment/Exercise - 06/30/16 0807      Neck Exercises: Supine   Neck Retraction 10 reps;5 secs   Other Supine Exercise supine foam roll routine, with ceiling punches, alt ceiling punches, horizontal abd/ add, backstrock 1 x 10 each.  thoracic extension over bolster 2 x 10 with elbows adducted.    Other Supine Exercise scapular stabilizers abduction narrow grip 1 x  10 with flexion, wide grip 1 x 10,  PNF D2 1 x 10 on L      Neck Exercises: Prone   Other Prone Exercise lower trap exercise 1 x 10   with red theraband given as HEP     Shoulder Exercises: ROM/Strengthening   UBE (Upper Arm Bike) L 1 x 4 min  changing direction at 2 min     Shoulder Exercises: Stretch   Corner Stretch 2 reps;30 seconds     Manual Therapy   Manual therapy comments manual trigger point release x 3 in L upper trap    Joint Mobilization C3-C7 central P>A grade 3 mobs, and grade 2 L gapping mobs,  grade 2 1st rib mobs on L      Neck Exercises: Stretches   Upper Trapezius Stretch 2 reps;30 seconds   Corner Stretch 2 reps;30 seconds  above horizontal for lower pec stretch   Other Neck Stretches scalene 2 x 30   focus on L                  PT Short Term Goals - 06/27/16 [id number]  PT SHORT TERM GOAL #1   Title pt will be I with initial HEP   Time 3   Period Weeks   Status Achieved           PT Long Term Goals - 06/06/16 1107      PT LONG TERM GOAL #1   Title pt will be able to perform her work duties without L UE radicular sxs >/= 75% of the time   Baseline unable   Time 6   Period Weeks   Status New     PT LONG TERM GOAL #2   Title pt will be able to hold a grocery bag with her L hand with </= 3/10 L shoulder/cervical pain   Baseline 8/10   Time 6   Period Weeks   Status New     PT LONG TERM GOAL #3   Title pt will be able to position her purse strap across her L clavicle without pain   Baseline 6/10   Time 6   Period Weeks   Status New     PT LONG TERM GOAL #4   Title Pt will be able to sleep in L sidelying with 75% less pain   Baseline unable   Time 6   Period Weeks   Status New               Plan - 06/30/16 4098    Clinical Impression Statement Anna Hobbs reports increased soreness at 4/10 today in the L shoulder, following manual with manual trigger point release, 1st rib mobs, and thoracic mobility exercises she  reported decreased pain to 2/10. she declined modalities post session.    PT Next Visit Plan DN PRN, 1st rib mobs, upper trap inhibition taping PRN, thoracic mobility exercises, modalities PRN, ROM, GOALS   PT Home Exercise Plan lower trap strengthening.       Patient will benefit from skilled therapeutic intervention in order to improve the following deficits and impairments:  Decreased activity tolerance, Decreased mobility, Decreased range of motion, Decreased strength, Hypomobility, Increased muscle spasms, Impaired flexibility, Impaired sensation, Impaired UE functional use  Visit Diagnosis: Cervicalgia  Pain in left arm  Muscle weakness (generalized)     Problem List Patient Active Problem List   Diagnosis Date Noted  . S/P hemorrhoidectomy 07/23/2012  . Hemorrhoids 05/14/2012  . Migraines 05/14/2012   Anna Hobbs PT, DPT, LAT, ATC  06/30/16  9:26 AM      [facility] [address] [place], [place], [address] Phone: [phone]   Fax:  [phone]  Name: Anna Hobbs MRN: [id number] Date of Birth: Jul 20, 1966

## 2016-07-01 ENCOUNTER — Ambulatory Visit: Admitting: Physical Therapy

## 2016-07-02 ENCOUNTER — Encounter: Admitting: Physical Therapy

## 2016-07-04 ENCOUNTER — Ambulatory Visit: Admitting: Physical Therapy

## 2016-07-04 DIAGNOSIS — M542 Cervicalgia: Secondary | ICD-10-CM | POA: Diagnosis not present

## 2016-07-04 DIAGNOSIS — M79602 Pain in left arm: Secondary | ICD-10-CM

## 2016-07-04 DIAGNOSIS — M6281 Muscle weakness (generalized): Secondary | ICD-10-CM

## 2016-07-04 NOTE — Therapy (Signed)
[facility] [address] [place], [place], [address] Phone: [phone]   Fax:  [phone]  Physical Therapy Treatment  Patient Details  Name: Anna Hobbs MRN: [id number] Date of Birth: October 29, 1965 Referring Provider: Delano Metz  Encounter Date: 07/04/2016      PT End of Session - 07/04/16 0929    Visit Number 10   Number of Visits 18   Date for PT Re-Evaluation 07/18/16   PT Start Time 0800   PT Stop Time 0845   PT Time Calculation (min) 45 min   Activity Tolerance Patient tolerated treatment well   Behavior During Therapy [facility] for tasks assessed/performed      Past Medical History:  Diagnosis Date  . Depression   . Dysrhythmia    hx PVC  . Hemorrhoids   . IBS (irritable bowel syndrome)    ruling out  . Migraine     Past Surgical History:  Procedure Laterality Date  . BREAST SURGERY     needle core bx-bilat-neg  . CYSTECTOMY     cyst on neck  . HEMORRHOID SURGERY     Hemorrhoidectomy  . LAPAROSCOPY     to r/o endometriosis  . WISDOM TOOTH EXTRACTION      There were no vitals filed for this visit.      Subjective Assessment - 07/04/16 0757    Subjective "its been waking me up again, I was up for a while last night. getting the tingly feeling in the neck"    Currently in Pain? Yes   Pain Score 2    Pain Location Shoulder   Pain Orientation Left   Pain Descriptors / Indicators Aching   Pain Type Chronic pain   Pain Onset More than a month ago   Pain Frequency Intermittent   Aggravating Factors  lifting/ pushing,    Pain Relieving Factors DN, getting up moving around.                          [facility] Adult PT Treatment/Exercise - 07/04/16 0001      Modalities   Modalities Ultrasound     Ultrasound   Ultrasound Location L coracobrachialis   Ultrasound Parameters 100% L .5 w/cm2 x 8 min   Ultrasound Goals Pain     Manual Therapy   Manual Therapy Taping   Soft tissue  mobilization IASTM over the L coracobrachialis   Myofascial Release stretching/ rolling over the coracobrachilais   Kinesiotex Inhibit Muscle;Facilitate Muscle     Kinesiotix   Inhibit Muscle  coracobrachilalis   Facilitate Muscle  bil upper rhomboids          Trigger Point Dry Needling - 07/04/16 0931    Consent Given? Yes   Education Handout Provided Yes   Muscles Treated Upper Body --  coracobrachialis with twitch and lengthening noted              PT Education - 07/04/16 0928    Education provided Yes   Education Details benefits of KT tape for inhibtion of the coracobrachialis and promote rhomboid activation, and after care and how long to wear the tape.    Person(s) Educated Patient   Methods Explanation;Verbal cues   Comprehension Verbalized understanding;Verbal cues required          PT Short Term Goals - 06/27/16 0954      PT SHORT TERM GOAL #1   Title pt will be I with initial HEP  Time 3   Period Weeks   Status Achieved           PT Long Term Goals - 06/06/16 1107      PT LONG TERM GOAL #1   Title pt will be able to perform her work duties without L UE radicular sxs >/= 75% of the time   Baseline unable   Time 6   Period Weeks   Status New     PT LONG TERM GOAL #2   Title pt will be able to hold a grocery bag with her L hand with </= 3/10 L shoulder/cervical pain   Baseline 8/10   Time 6   Period Weeks   Status New     PT LONG TERM GOAL #3   Title pt will be able to position her purse strap across her L clavicle without pain   Baseline 6/10   Time 6   Period Weeks   Status New     PT LONG TERM GOAL #4   Title Pt will be able to sleep in L sidelying with 75% less pain   Baseline unable   Time 6   Period Weeks   Status New               Plan - 07/04/16 0929    Clinical Impression Statement pt reports increased soreness with tingling in the hand rated at 4/10 today.  DN was performed on the coracobrachalis followed  with soft tissue work and ultra sound. KT tape was applied to inhibit coracobrachilais and facilitate rhomboids, pt declined MHP post session and reported pain droppted to 2/10 with no tingling in the hand post sesison.    PT Next Visit Plan FOTO, ROM, GOALS, assess DN/ KT tape, DN PRN, 1st rib mobs, upper trap inhibition taping PRN, thoracic mobility exercises, modalities   Consulted and Agree with Plan of Care Patient      Patient will benefit from skilled therapeutic intervention in order to improve the following deficits and impairments:  Decreased activity tolerance, Decreased mobility, Decreased range of motion, Decreased strength, Hypomobility, Increased muscle spasms, Impaired flexibility, Impaired sensation, Impaired UE functional use  Visit Diagnosis: Cervicalgia  Pain in left arm  Muscle weakness (generalized)     Problem List Patient Active Problem List   Diagnosis Date Noted  . S/P hemorrhoidectomy 07/23/2012  . Hemorrhoids 05/14/2012  . Migraines 05/14/2012   Lulu RidingKristoffer Rosey Eide PT, DPT, LAT, ATC  07/04/16  9:33 AM      [facility] [address] [place], [place], [address] Phone: [phone]   Fax:  [phone]  Name: Anna Hobbs MRN: [id number] Date of Birth: 1966-03-26

## 2016-07-08 ENCOUNTER — Ambulatory Visit: Admitting: Physical Therapy

## 2016-07-08 DIAGNOSIS — M6281 Muscle weakness (generalized): Secondary | ICD-10-CM

## 2016-07-08 DIAGNOSIS — M542 Cervicalgia: Secondary | ICD-10-CM

## 2016-07-08 DIAGNOSIS — M79602 Pain in left arm: Secondary | ICD-10-CM

## 2016-07-08 NOTE — Therapy (Signed)
[facility] [address] [place], [place], [address] Phone: [phone]   Fax:  [phone]  Physical Therapy Treatment  Patient Details  Name: Anna Hobbs MRN: [id number] Date of Birth: Jun 17, 1966 Referring Provider: Delano Metz  Encounter Date: 07/08/2016      PT End of Session - 07/08/16 1515    Visit Number 11   Number of Visits 18   Date for PT Re-Evaluation 07/18/16   PT Start Time 1415   PT Stop Time 1505   PT Time Calculation (min) 50 min   Activity Tolerance Patient tolerated treatment well   Behavior During Therapy [facility] for tasks assessed/performed      Past Medical History:  Diagnosis Date  . Depression   . Dysrhythmia    hx PVC  . Hemorrhoids   . IBS (irritable bowel syndrome)    ruling out  . Migraine     Past Surgical History:  Procedure Laterality Date  . BREAST SURGERY     needle core bx-bilat-neg  . CYSTECTOMY     cyst on neck  . HEMORRHOID SURGERY     Hemorrhoidectomy  . LAPAROSCOPY     to r/o endometriosis  . WISDOM TOOTH EXTRACTION      There were no vitals filed for this visit.      Subjective Assessment - 07/08/16 1419    Subjective (P)  "I had to reach into the safe at work and had some soreness"    Currently in Pain? (P)  Yes   Pain Score (P)  2    Pain Location (P)  Shoulder   Pain Orientation (P)  Left   Pain Descriptors / Indicators (P)  Aching   Pain Type (P)  Chronic pain   Pain Onset (P)  More than a month ago   Pain Frequency (P)  Intermittent            [facility] PT Assessment - 07/08/16 0001      Observation/Other Assessments   Focus on Therapeutic Outcomes (FOTO)  FOTO 50% limitation     AROM   Cervical Flexion 56  ERP   Cervical Extension 70  ERp   Cervical - Right Side Bend 30   Cervical - Left Side Bend 41   Cervical - Right Rotation 80   Cervical - Left Rotation 75                     [facility] Adult PT Treatment/Exercise -  07/08/16 0001      Neck Exercises: Seated   Cervical Rotation Both;10 reps  upper cervical rotation using 4 fingers dropping down to non     Neck Exercises: Sidelying   Other Sidelying Exercise book opening 2 x 10 performed bil     Manual Therapy   Manual therapy comments manual trigger point release x 3 in L upper trap    Joint Mobilization C3-C7 central P>A grade 3 mobs, and grade 2 L gapping mobs on L at C3-C6, grade 2 distraction with manual cervical rotation to the L   Myofascial Release stretching/ rolling over the coracobrachilais     Neck Exercises: Stretches   Upper Trapezius Stretch 3 reps;30 seconds  contract/ relax with 10 sec hold   Corner Stretch 30 seconds                PT Education - 07/08/16 1514    Education provided Yes   Education Details updated HEP for thoracic and upper  cervical mobility   Person(s) Educated Patient   Methods Explanation;Verbal cues;Handout   Comprehension Verbalized understanding;Verbal cues required          PT Short Term Goals - 07/08/16 1518      PT SHORT TERM GOAL #1   Title pt will be I with initial HEP   Time 3   Period Weeks   Status Achieved     PT SHORT TERM GOAL #2   Title pt will report decreased L UE radicular sxs with work duties x 25%   Time 3   Period Weeks   Status Achieved     PT SHORT TERM GOAL #3   Title pt will report decreased suboccipital tension headaches x 25%   Time 3   Period Weeks   Status Achieved     PT SHORT TERM GOAL #4   Title pt will report being able to hold her phone with 5/10 shoulder pain L   Time 3   Period Weeks   Status Achieved           PT Long Term Goals - 07/08/16 1519      PT LONG TERM GOAL #1   Title pt will be able to perform her work duties without L UE radicular sxs >/= 75% of the time   Time 6   Period Weeks   Status On-going     PT LONG TERM GOAL #2   Title pt will be able to hold a grocery bag with her L hand with </= 3/10 L shoulder/cervical  pain   Time 6   Period Weeks   Status On-going     PT LONG TERM GOAL #3   Title pt will be able to position her purse strap across her L clavicle without pain   Time 6   Period Weeks   Status On-going     PT LONG TERM GOAL #4   Title Pt will be able to sleep in L sidelying with 75% less pain   Time 6   Period Weeks   Status On-going               Plan - 07/08/16 1515    Clinical Impression Statement Mrs. Carolann Littlerthurbon reports 2/10 pain and demonstrates improvement in cervical mobility in all planes with end range soreness. focused on manual to improve mobility, and thoracic mobility and cervical mobility exercises with she visually demonstrated improving motion throughout exericse.  progressing well meeting all STG's.    PT Next Visit Plan DN PRN, 1st rib mobs, upper trap inhibition taping PRN, thoracic mobility exercises, modalities, SNags, mobs with movement, self rib mobs, I's T's, and Y's   PT Home Exercise Plan lower trap strengthening, book opening, upper cervical rotaion with 4 finger technique   Consulted and Agree with Plan of Care Patient      Patient will benefit from skilled therapeutic intervention in order to improve the following deficits and impairments:  Decreased activity tolerance, Decreased mobility, Decreased range of motion, Decreased strength, Hypomobility, Increased muscle spasms, Impaired flexibility, Impaired sensation, Impaired UE functional use  Visit Diagnosis: Cervicalgia  Pain in left arm  Muscle weakness (generalized)     Problem List Patient Active Problem List   Diagnosis Date Noted  . S/P hemorrhoidectomy 07/23/2012  . Hemorrhoids 05/14/2012  . Migraines 05/14/2012   Lulu RidingKristoffer Jalil Lorusso PT, DPT, LAT, ATC  07/08/16  3:24 PM      [facility] [address] [place], [address] Phone: [phone]   Fax:  [phone]  Name: TAIYANA KISSLER MRN: [id number] Date of Birth:  09/09/1966

## 2016-07-10 ENCOUNTER — Ambulatory Visit: Admitting: Physical Therapy

## 2016-07-10 DIAGNOSIS — M542 Cervicalgia: Secondary | ICD-10-CM

## 2016-07-10 DIAGNOSIS — M79602 Pain in left arm: Secondary | ICD-10-CM

## 2016-07-10 DIAGNOSIS — M6281 Muscle weakness (generalized): Secondary | ICD-10-CM

## 2016-07-10 NOTE — Therapy (Signed)
[facility] [address] [place], [place], [address] Phone: [phone]   Fax:  [phone]  Physical Therapy Treatment  Patient Details  Name: Anna Hobbs MRN: [id number] Date of Birth: Aug 30, 1966 Referring Provider: Delano Metzorrington, Kip  Encounter Date: 07/10/2016      PT End of Session - 07/10/16 1452    Visit Number 12   Number of Visits 18   Date for PT Re-Evaluation 07/18/16   PT Start Time 1400   PT Stop Time 1448   PT Time Calculation (min) 48 min   Activity Tolerance Patient tolerated treatment well   Behavior During Therapy [facility] for tasks assessed/performed      Past Medical History:  Diagnosis Date  . Depression   . Dysrhythmia    hx PVC  . Hemorrhoids   . IBS (irritable bowel syndrome)    ruling out  . Migraine     Past Surgical History:  Procedure Laterality Date  . BREAST SURGERY     needle core bx-bilat-neg  . CYSTECTOMY     cyst on neck  . HEMORRHOID SURGERY     Hemorrhoidectomy  . LAPAROSCOPY     to r/o endometriosis  . WISDOM TOOTH EXTRACTION      There were no vitals filed for this visit.      Subjective Assessment - 07/10/16 1406    Subjective "I was sore after the last session, but today im not that bad"    Currently in Pain? Yes   Pain Score 1    Pain Location Shoulder   Pain Orientation Left   Pain Descriptors / Indicators Aching   Pain Type Chronic pain   Pain Frequency Intermittent   Aggravating Factors  lifting / pushing    Pain Relieving Factors DN, getting up moving around.                          [facility] Adult PT Treatment/Exercise - 07/10/16 1407      Shoulder Exercises: ROM/Strengthening   UBE (Upper Arm Bike) L1 x 6 min  changing direction at 3 min     Manual Therapy   Manual therapy comments manual trigger point release of the sub-clavius and pec minor   Joint Mobilization C3-C7 central P>A grade 3 mobs, and grade 2 L gapping mobs on L at  C3-C6, grade 2 distraction with manual cervical rotation to the L, distal clavicle grade 3 mobs inferior/ posterior.    Myofascial Release stretching/ rolling over the coracobrachilais   Neural Stretch median nerve glides , with rolling from distal to proximal  pt report decreased pain      Neck Exercises: Stretches   Upper Trapezius Stretch 3 reps;30 seconds   Other Neck Stretches scalene 2 x 30                 PT Education - 07/10/16 1452    Education Details updated HEP SNAGs and pec stretching with proper form   Person(s) Educated Patient   Methods Explanation;Verbal cues   Comprehension Verbalized understanding;Verbal cues required          PT Short Term Goals - 07/08/16 1518      PT SHORT TERM GOAL #1   Title pt will be I with initial HEP   Time 3   Period Weeks   Status Achieved     PT SHORT TERM GOAL #2   Title pt will report decreased L UE radicular sxs  with work duties x 25%   Time 3   Period Weeks   Status Achieved     PT SHORT TERM GOAL #3   Title pt will report decreased suboccipital tension headaches x 25%   Time 3   Period Weeks   Status Achieved     PT SHORT TERM GOAL #4   Title pt will report being able to hold her phone with 5/10 shoulder pain L   Time 3   Period Weeks   Status Achieved           PT Long Term Goals - 07/08/16 1519      PT LONG TERM GOAL #1   Title pt will be able to perform her work duties without L UE radicular sxs >/= 75% of the time   Time 6   Period Weeks   Status On-going     PT LONG TERM GOAL #2   Title pt will be able to hold a grocery bag with her L hand with </= 3/10 L shoulder/cervical pain   Time 6   Period Weeks   Status On-going     PT LONG TERM GOAL #3   Title pt will be able to position her purse strap across her L clavicle without pain   Time 6   Period Weeks   Status On-going     PT LONG TERM GOAL #4   Title Pt will be able to sleep in L sidelying with 75% less pain   Time 6   Period  Weeks   Status On-going               Plan - 07/10/16 1452    Clinical Impression Statement Anna Hobbs continues to report 2/10 pain in the L anterior shoulder. focused on manual to increased clavicle mobility and nerve glides and stretching which she did well. She reported no tingling in the L shoulder with flexion/ abduction.    PT Next Visit Plan DN PRN, 1st rib mobs, upper trap inhibition taping PRN, thoracic mobility exercises, modalities, self rib mobs, I's T's, and Y's   PT Home Exercise Plan lower trap strengthening, book opening, upper cervical rotaion with 4 finger technique, SNAGS, pec stretch on foam roll.    Consulted and Agree with Plan of Care Patient      Patient will benefit from skilled therapeutic intervention in order to improve the following deficits and impairments:  Decreased activity tolerance, Decreased mobility, Decreased range of motion, Decreased strength, Hypomobility, Increased muscle spasms, Impaired flexibility, Impaired sensation, Impaired UE functional use  Visit Diagnosis: Cervicalgia  Pain in left arm  Muscle weakness (generalized)     Problem List Patient Active Problem List   Diagnosis Date Noted  . S/P hemorrhoidectomy 07/23/2012  . Hemorrhoids 05/14/2012  . Migraines 05/14/2012   Anna Hobbs PT, DPT, LAT, ATC  07/10/16  2:59 PM      [facility] [address] [place], [place], [address] Phone: [phone]   Fax:  [phone]  Name: Anna Hobbs MRN: [id number] Date of Birth: Nov 16, 1965

## 2016-07-15 ENCOUNTER — Ambulatory Visit: Admitting: Physical Therapy

## 2016-07-15 DIAGNOSIS — M79602 Pain in left arm: Secondary | ICD-10-CM

## 2016-07-15 DIAGNOSIS — M6281 Muscle weakness (generalized): Secondary | ICD-10-CM

## 2016-07-15 DIAGNOSIS — M542 Cervicalgia: Secondary | ICD-10-CM

## 2016-07-15 NOTE — Therapy (Signed)
[facility] [address] [place], [place], [address] Phone: [phone]   Fax:  [phone]  Physical Therapy Treatment  Patient Details  Name: Anna Hobbs MRN: [id number] Date of Birth: 1966-08-17 Referring Provider: Delano Metzorrington, Kip  Encounter Date: 07/15/2016      PT End of Session - 07/15/16 1625    Visit Number 13   Number of Visits 18   Date for PT Re-Evaluation 07/18/16   PT Start Time 1547   PT Stop Time 1635   PT Time Calculation (min) 48 min   Activity Tolerance Patient tolerated treatment well   Behavior During Therapy [facility] for tasks assessed/performed      Past Medical History:  Diagnosis Date  . Depression   . Dysrhythmia    hx PVC  . Hemorrhoids   . IBS (irritable bowel syndrome)    ruling out  . Migraine     Past Surgical History:  Procedure Laterality Date  . BREAST SURGERY     needle core bx-bilat-neg  . CYSTECTOMY     cyst on neck  . HEMORRHOID SURGERY     Hemorrhoidectomy  . LAPAROSCOPY     to r/o endometriosis  . WISDOM TOOTH EXTRACTION      There were no vitals filed for this visit.      Subjective Assessment - 07/15/16 1552    Subjective "I wasn't able to sleep the other night due to feeling more achey in the neck, I have more a headache today"    Currently in Pain? Yes   Pain Score 1    Pain Orientation Left   Pain Descriptors / Indicators Aching   Pain Type Chronic pain   Pain Onset More than a month ago   Pain Frequency Intermittent                         [facility] Adult PT Treatment/Exercise - 07/15/16 1555      Neck Exercises: Standing   Neck Retraction 10 reps;5 secs     Shoulder Exercises: ROM/Strengthening   UBE (Upper Arm Bike) L2 x 6 min   changing every 3 min     Shoulder Exercises: Stretch   Corner Stretch 2 reps;30 seconds     Traction   Type of Traction Cervical   Min (lbs) 6   Max (lbs) 14   Hold Time 60   Rest Time 60   Time 15      Manual Therapy   Manual therapy comments manual trigger point release of the L upper trap, sub-occipital release   Neural Stretch median nerve glides , with rolling from distal to proximal     Neck Exercises: Stretches   Upper Trapezius Stretch 2 reps;30 seconds   Other Neck Stretches scalene 2 x 30                 PT Education - 07/15/16 1624    Education provided Yes   Education Details sitting posture with forward head posture and effects of the sub-occiptials causing headaches and compression of pain inthe neck. how to perform sub-occipital release using tennis balls   Person(s) Educated Patient   Methods Explanation;Verbal cues;Demonstration   Comprehension Verbalized understanding;Verbal cues required;Returned demonstration          PT Short Term Goals - 07/08/16 1518      PT SHORT TERM GOAL #1   Title pt will be I with initial HEP   Time 3  Period Weeks   Status Achieved     PT SHORT TERM GOAL #2   Title pt will report decreased L UE radicular sxs with work duties x 25%   Time 3   Period Weeks   Status Achieved     PT SHORT TERM GOAL #3   Title pt will report decreased suboccipital tension headaches x 25%   Time 3   Period Weeks   Status Achieved     PT SHORT TERM GOAL #4   Title pt will report being able to hold her phone with 5/10 shoulder pain L   Time 3   Period Weeks   Status Achieved           PT Long Term Goals - 07/08/16 1519      PT LONG TERM GOAL #1   Title pt will be able to perform her work duties without L UE radicular sxs >/= 75% of the time   Time 6   Period Weeks   Status On-going     PT LONG TERM GOAL #2   Title pt will be able to hold a grocery bag with her L hand with </= 3/10 L shoulder/cervical pain   Time 6   Period Weeks   Status On-going     PT LONG TERM GOAL #3   Title pt will be able to position her purse strap across her L clavicle without pain   Time 6   Period Weeks   Status On-going     PT LONG  TERM GOAL #4   Title Pt will be able to sleep in L sidelying with 75% less pain   Time 6   Period Weeks   Status On-going               Plan - 07/15/16 1626    Clinical Impression Statement [name] reports only 1/10 pain today with more difficulty of a headache and L upper trap tightness. Following manual triggerpoint release and sub-occiptal release she reported decreased HA and was able to move the LUE around with no tingling or pain. performed mechanical traction to continue to with stretch and relieve compression on nerves.    PT Next Visit Plan DN PRN, 1st rib mobs, upper trap inhibition taping PRN, thoracic mobility exercises, modalities, self rib mobs, I's T's, and Y's   PT Home Exercise Plan lower trap strengthening, book opening, upper cervical rotaion with 4 finger technique, SNAGS, pec stretch on foam roll.    Consulted and Agree with Plan of Care Patient      Patient will benefit from skilled therapeutic intervention in order to improve the following deficits and impairments:  Decreased activity tolerance, Decreased mobility, Decreased range of motion, Decreased strength, Hypomobility, Increased muscle spasms, Impaired flexibility, Impaired sensation, Impaired UE functional use  Visit Diagnosis: Cervicalgia  Pain in left arm  Muscle weakness (generalized)     Problem List Patient Active Problem List   Diagnosis Date Noted  . S/P hemorrhoidectomy 07/23/2012  . Hemorrhoids 05/14/2012  . Migraines 05/14/2012   Anna Hobbs PT, DPT, LAT, ATC  07/15/16  4:36 PM      [facility] [address] [place], [place], [address] Phone: [phone]   Fax:  [phone]  Name: Anna Hobbs MRN: [id number] Date of Birth: [birth year]

## 2016-07-17 ENCOUNTER — Ambulatory Visit: Admitting: Physical Therapy

## 2016-07-17 DIAGNOSIS — M542 Cervicalgia: Secondary | ICD-10-CM

## 2016-07-17 DIAGNOSIS — M79602 Pain in left arm: Secondary | ICD-10-CM

## 2016-07-17 DIAGNOSIS — M6281 Muscle weakness (generalized): Secondary | ICD-10-CM

## 2016-07-17 NOTE — Therapy (Signed)
[facility] [address] [place], [place], [address] Phone: [phone]   Fax:  [phone]  Physical Therapy Treatment  Patient Details  Name: Anna Hobbs MRN: [id number] Date of Birth: [birth year] Referring Provider: Delano Metzorrington, Kip  Encounter Date: 07/17/2016      PT End of Session - 07/17/16 1524    Visit Number 14   Number of Visits 18   Date for PT Re-Evaluation 07/18/16   PT Start Time 1417   PT Stop Time 1502   PT Time Calculation (min) 45 min   Activity Tolerance Patient tolerated treatment well   Behavior During Therapy [facility] for tasks assessed/performed      Past Medical History:  Diagnosis Date  . Depression   . Dysrhythmia    hx PVC  . Hemorrhoids   . IBS (irritable bowel syndrome)    ruling out  . Migraine     Past Surgical History:  Procedure Laterality Date  . BREAST SURGERY     needle core bx-bilat-neg  . CYSTECTOMY     cyst on neck  . HEMORRHOID SURGERY     Hemorrhoidectomy  . LAPAROSCOPY     to r/o endometriosis  . WISDOM TOOTH EXTRACTION      There were no vitals filed for this visit.      Subjective Assessment - 07/17/16 1421    Subjective Mor movement with PT .Marland Kitchen. Pain and tingkling triggered with piching up objects with left arm.    Currently in Pain? Yes   Pain Score 0-No pain   Pain Location Neck   Pain Orientation Left   Pain Descriptors / Indicators Aching   Pain Radiating Towards lateral wrist   Aggravating Factors  lifting,  turning head   Pain Relieving Factors moving,  avoiding lifting.    Effect of Pain on Daily Activities occasionally sitting,  limits lifting                         [facility] Adult PT Treatment/Exercise - 07/17/16 0001      Neck Exercises: Sidelying   Other Sidelying Exercise 10 X bookopener.  cued for neutral spine  both,  stretch only     Shoulder Exercises: ROM/Strengthening   UBE (Upper Arm Bike) L2 x 6 min   changing every  3 min     Manual Therapy   Manual therapy comments manual trigger point release of the L upper trap, sub-occipital release   Joint Mobilization 1st rib , left 5 X   Soft tissue mobilization soft tissue work peri scapular and was able to mobilize scapula in sitting medial border to rhomboids.  Passive upper trap stretch.  Myofascial release anterior sho   Neural Stretch median nerve glides , with rolling from distal to proximal  guiding head and shoulder vs using whole arm.                   PT Short Term Goals - 07/08/16 1518      PT SHORT TERM GOAL #1   Title pt will be I with initial HEP   Time 3   Period Weeks   Status Achieved     PT SHORT TERM GOAL #2   Title pt will report decreased L UE radicular sxs with work duties x 25%   Time 3   Period Weeks   Status Achieved     PT SHORT TERM GOAL #3   Title pt will report  decreased suboccipital tension headaches x 25%   Time 3   Period Weeks   Status Achieved     PT SHORT TERM GOAL #4   Title pt will report being able to hold her phone with 5/10 shoulder pain L   Time 3   Period Weeks   Status Achieved           PT Long Term Goals - 07/17/16 1527      PT LONG TERM GOAL #1   Title pt will be able to perform her work duties without L UE radicular sxs >/= 75% of the time   Baseline  radicular Symptome  x 1 today at work   Time 6   Period Weeks   Status On-going     PT LONG TERM GOAL #2   Title pt will be able to hold a grocery bag with her L hand with </= 3/10 L shoulder/cervical pain   Time 6   Period Weeks   Status Unable to assess     PT LONG TERM GOAL #3   Title pt will be able to position her purse strap across her L clavicle without pain   Time 6   Period Weeks   Status Unable to assess     PT LONG TERM GOAL #4   Title Pt will be able to sleep in L sidelying with 75% less pain   Time 6   Period Weeks   Status Unable to assess               Plan - 07/17/16 1524    Clinical  Impression Statement No real pain now.  Lifting empty cardboard boxes with left arm reproduced tingling to wrist. She has full crevical rotation post session with mild Lt tightness and soreness from manual.   She was able to use tennis balls successfully at home X1.  No HA today.   PT Next Visit Plan DN PRN, 1st rib mobs, upper trap inhibition taping PRN, thoracic mobility exercises, modalities, self rib mobs, I's T's, and Y's   PT Home Exercise Plan lower trap strengthening, book opening, upper cervical rotaion with 4 finger technique, SNAGS, pec stretch on foam roll.    Consulted and Agree with Plan of Care Patient      Patient will benefit from skilled therapeutic intervention in order to improve the following deficits and impairments:  Decreased activity tolerance, Decreased mobility, Decreased range of motion, Decreased strength, Hypomobility, Increased muscle spasms, Impaired flexibility, Impaired sensation, Impaired UE functional use  Visit Diagnosis: Cervicalgia  Pain in left arm  Muscle weakness (generalized)     Problem List Patient Active Problem List   Diagnosis Date Noted  . S/P hemorrhoidectomy 07/23/2012  . Hemorrhoids 05/14/2012  . Migraines 05/14/2012    Kaevon Cotta,Marisa PTA 07/17/2016, 3:30 PM  [facility] [address] [place], [place], [address] Phone: [phone]   Fax:  [phone]  Name: Anna Hobbs MRN: [id number] Date of Birth: Sep 28, 1965

## 2016-07-22 ENCOUNTER — Ambulatory Visit: Admitting: Physical Therapy

## 2016-07-22 DIAGNOSIS — M542 Cervicalgia: Secondary | ICD-10-CM | POA: Diagnosis not present

## 2016-07-22 DIAGNOSIS — M79602 Pain in left arm: Secondary | ICD-10-CM

## 2016-07-22 DIAGNOSIS — M6281 Muscle weakness (generalized): Secondary | ICD-10-CM

## 2016-07-22 NOTE — Therapy (Signed)
[place], [place], [address] Phone: [phone]   Fax:  [phone]  Physical Therapy Treatment/ Re-certification  Patient Details  Name: Anna Hobbs MRN: [id number] Date of Birth: [birth year] Referring Provider: Ledora Bottcher  Encounter Date: 07/22/2016      PT End of Session - 07/22/16 1646    Visit Number 15   Number of Visits 23   Date for PT Re-Evaluation 09/16/16   Authorization Type selfpay   PT Start Time 1500   PT Stop Time 1548   PT Time Calculation (min) 48 min   Activity Tolerance Patient tolerated treatment well   Behavior During Therapy [facility] for tasks assessed/performed      Past Medical History:  Diagnosis Date  . Depression   . Dysrhythmia    hx PVC  . Hemorrhoids   . IBS (irritable bowel syndrome)    ruling out  . Migraine     Past Surgical History:  Procedure Laterality Date  . BREAST SURGERY     needle core bx-bilat-neg  . CYSTECTOMY     cyst on neck  . HEMORRHOID SURGERY     Hemorrhoidectomy  . LAPAROSCOPY     to r/o endometriosis  . WISDOM TOOTH EXTRACTION      There were no vitals filed for this visit.      Subjective Assessment - 07/22/16 1456    Subjective "doing pretty good today, alittle pain but rated at a 1/10"   Currently in Pain? Yes   Pain Score 1    Pain Descriptors / Indicators Aching   Pain Type Chronic pain   Pain Radiating Towards L hand/ fingers   Pain Onset More than a month ago   Pain Frequency Intermittent   Aggravating Factors  laying on the L side, repetitive activity   Pain Relieving Factors moving arounding, lifting carrying,             [facility] PT Assessment - 07/22/16 0001      AROM   Cervical Flexion 60   Cervical Extension 60   Cervical - Right Side Bend 34   Cervical - Left Side Bend 40   Cervical - Right Rotation 78   Cervical - Left Rotation 68  ERP as tightness     Strength   Left Shoulder Flexion 4/5   Left Shoulder Extension 5/5   Left Shoulder ABduction 4/5  pain during testing   Left Shoulder Internal Rotation 4/5   Left Shoulder External Rotation 4/5     Palpation   Palpation comment soreness with tightness at the L upper trap, scalenes, and subscapularis                     [facility] Adult PT Treatment/Exercise - 07/22/16 0001      Shoulder Exercises: ROM/Strengthening   UBE (Upper Arm Bike) L2 x 6 min   changing direction at 3 min     Shoulder Exercises: Stretch   Corner Stretch 2 reps;30 seconds     Moist Heat Therapy   Number Minutes Moist Heat 10 Minutes   Moist Heat Location Shoulder;Cervical     Manual Therapy   Soft tissue mobilization soft tissue work peri scapular and was able to mobilize scapula in sitting medial border to rhomboids.     Neck Exercises: Stretches   Upper Trapezius Stretch 2 reps;30 seconds   Levator Stretch 2 reps;30 seconds   Other Neck Stretches scalene 2 x 30  Trigger Point Dry Needling - 07/22/16 1646    Consent Given? Yes   Education Handout Provided Yes  given previously   Muscles Treated Upper Body Subscapularis   Upper Trapezius Response Twitch reponse elicited;Palpable increased muscle length   Subscapularis Response Twitch response elicited;Palpable increased muscle length              PT Education - 07/22/16 1645    Education provided Yes   Education Details referral of the subscapularis to the hand causing pain and beneifts for DN in this muscle   Person(s) Educated Patient   Methods Explanation;Verbal cues   Comprehension Verbalized understanding;Verbal cues required          PT Short Term Goals - 07/08/16 1518      PT SHORT TERM GOAL #1   Title pt will be I with initial HEP   Time 3   Period Weeks   Status Achieved     PT SHORT TERM GOAL #2   Title pt will report decreased L UE radicular sxs with work duties x 25%   Time 3   Period Weeks   Status Achieved     PT SHORT TERM  GOAL #3   Title pt will report decreased suboccipital tension headaches x 25%   Time 3   Period Weeks   Status Achieved     PT SHORT TERM GOAL #4   Title pt will report being able to hold her phone with 5/10 shoulder pain L   Time 3   Period Weeks   Status Achieved           PT Long Term Goals - 07/22/16 1525      PT LONG TERM GOAL #1   Title pt will be able to perform her work duties without L UE radicular sxs >/= 75% of the time   Baseline with overhead reaching   Time 6   Period Weeks   Status On-going     PT LONG TERM GOAL #2   Title pt will be able to hold a grocery bag with her L hand with </= 3/10 L shoulder/cervical pain   Baseline 4-5/10 with carrying grocerys   Time 6   Period Weeks   Status On-going     PT LONG TERM GOAL #3   Title pt will be able to position her purse strap across her L clavicle without pain   Time 6   Period Weeks   Status On-going     PT LONG TERM GOAL #4   Title Pt will be able to sleep in L sidelying with 75% less pain   Baseline unable   Time 6   Period Weeks   Status On-going               Plan - 07/22/16 1648    Clinical Impression Statement Mrs. Hancher continues to make progress with only having pain in the shoulder with above head activities and with L sidelying. She has met all STG 's this visit and is progress with her LTG. She continues to improve with L shoulder strength and neck AROM.  continued DN working on L upper trap and sub-scapularis which reproduced the pain in her hand. performed stretch and soft tissue work post DN. plan to continue with current POC  to work toward remaining goals and independent exercise.    Rehab Potential Excellent   PT Frequency 2x / week   PT Duration 4 weeks   PT Treatment/Interventions ADLs/Self Care  Home Management;Cryotherapy;Electrical Stimulation;Iontophoresis '4mg'$ /ml Dexamethasone;Moist Heat;Traction;Ultrasound;Functional mobility training;Patient/family  education;Neuromuscular re-education;Therapeutic exercise;Therapeutic activities;Manual techniques;Passive range of motion;Taping;Dry needling   PT Next Visit Plan assess response to DN of sub-scapualris, 1st rib mobs, upper trap inhibition taping PRN, thoracic mobility exercises, modalities, self rib mobs, I's T's, and Y's   Consulted and Agree with Plan of Care Patient      Patient will benefit from skilled therapeutic intervention in order to improve the following deficits and impairments:  Decreased activity tolerance, Decreased mobility, Decreased range of motion, Decreased strength, Hypomobility, Increased muscle spasms, Impaired flexibility, Impaired sensation, Impaired UE functional use  Visit Diagnosis: Cervicalgia - Plan: PT plan of care cert/re-cert  Pain in left arm - Plan: PT plan of care cert/re-cert  Muscle weakness (generalized) - Plan: PT plan of care cert/re-cert     Problem List Patient Active Problem List   Diagnosis Date Noted  . S/P hemorrhoidectomy 07/23/2012  . Hemorrhoids 05/14/2012  . Migraines 05/14/2012   Starr Lake PT, DPT, LAT, ATC  07/22/16  4:54 PM      [facility] [address] [place], [place], [address] Phone: [phone]   Fax:  [phone]  Name: Anna Hobbs MRN: [id number] Date of Birth: [birth year]

## 2016-07-24 ENCOUNTER — Ambulatory Visit: Attending: Family Medicine | Admitting: Physical Therapy

## 2016-07-24 DIAGNOSIS — M542 Cervicalgia: Secondary | ICD-10-CM | POA: Diagnosis present

## 2016-07-24 DIAGNOSIS — M6281 Muscle weakness (generalized): Secondary | ICD-10-CM

## 2016-07-24 DIAGNOSIS — M79602 Pain in left arm: Secondary | ICD-10-CM | POA: Diagnosis present

## 2016-07-24 NOTE — Therapy (Signed)
[facility] [address] [place], [place], [address] Phone: [phone]   Fax:  [phone]  Physical Therapy Treatment  Patient Details  Name: Anna Hobbs MRN: [id number] Date of Birth: July 20, 1966 Referring Provider: Delano Metzorrington, Kip  Encounter Date: 07/24/2016      PT End of Session - 07/24/16 1611    Visit Number 16   Number of Visits 23   Date for PT Re-Evaluation 09/16/16   PT Start Time 1415   PT Stop Time 1500   PT Time Calculation (min) 45 min   Activity Tolerance Patient tolerated treatment well   Behavior During Therapy [facility] for tasks assessed/performed      Past Medical History:  Diagnosis Date  . Depression   . Dysrhythmia    hx PVC  . Hemorrhoids   . IBS (irritable bowel syndrome)    ruling out  . Migraine     Past Surgical History:  Procedure Laterality Date  . BREAST SURGERY     needle core bx-bilat-neg  . CYSTECTOMY     cyst on neck  . HEMORRHOID SURGERY     Hemorrhoidectomy  . LAPAROSCOPY     to r/o endometriosis  . WISDOM TOOTH EXTRACTION      There were no vitals filed for this visit.      Subjective Assessment - 07/24/16 1420    Subjective Deep soreness with driving to PT.  Noticed the airconditioner was blowing right oin her shoulder making it ach.  DN to arm pit helped decrease the tingling.    Currently in Pain? Yes   Pain Score --  mild.   Pain Location Shoulder   Pain Orientation Left   Pain Descriptors / Indicators Aching  deep   Pain Frequency Intermittent   Aggravating Factors  driving with air conditioner directed on shoulder.   Pain Relieving Factors change of position   Effect of Pain on Daily Activities moves with caution   Multiple Pain Sites No                         [facility] Adult PT Treatment/Exercise - 07/24/16 0001      Neck Exercises: Supine   Other Supine Exercise Supine foam roller : 10 x 2 sets eack of backhand,  horizontalabduction,  reaching for ceiling,   angels in the snow.   monitored for technique and pain.        Shoulder Exercises: ROM/Strengthening   UBE (Upper Arm Bike) L2 X 6 minutes  both directions,  some increased pain with this noted after   Other ROM/Strengthening Exercises lower trap strengthening with yellow band 10 X 2 sets.  Cued to keep shoulder from rising with exercise.      Shoulder Exercises: Stretch   Other Shoulder Stretches Hugging stretch 2 x 20 reps with cues for deep breath with stretch     Manual Therapy   Soft tissue mobilization scapular area, shoulder left neck and proximal arm , instrument assist, intermittantly.  Myofascial release. ,  tissue softened.                  PT Education - 07/24/16 1610    Education provided Yes          PT Short Term Goals - 07/08/16 1518      PT SHORT TERM GOAL #1   Title pt will be I with initial HEP   Time 3   Period Weeks  Status Achieved     PT SHORT TERM GOAL #2   Title pt will report decreased L UE radicular sxs with work duties x 25%   Time 3   Period Weeks   Status Achieved     PT SHORT TERM GOAL #3   Title pt will report decreased suboccipital tension headaches x 25%   Time 3   Period Weeks   Status Achieved     PT SHORT TERM GOAL #4   Title pt will report being able to hold her phone with 5/10 shoulder pain L   Time 3   Period Weeks   Status Achieved           PT Long Term Goals - 07/24/16 1616      PT LONG TERM GOAL #1   Title pt will be able to perform her work duties without L UE radicular sxs >/= 75% of the time   Baseline less frequent.  Had been gone since last visit untie exercises today   Time 6   Period Weeks   Status On-going     PT LONG TERM GOAL #2   Title pt will be able to hold a grocery bag with her L hand with </= 3/10 L shoulder/cervical pain   Time 6   Period Weeks   Status Unable to assess     PT LONG TERM GOAL #3   Title pt will be able to position her purse strap across her  L clavicle without pain   Time 6   Period Weeks   Status Unable to assess     PT LONG TERM GOAL #4   Title Pt will be able to sleep in L sidelying with 75% less pain   Baseline some pain with this   Time 6   Period Weeks   Status On-going               Plan - 07/24/16 1612    Clinical Impression Statement Intermittant symptom return with horizontal stretches .  She had mild soreness post session, She declined the need for modalities for pain.( She had had no tingling since the last visit)   PT Next Visit Plan assess response to DN of sub-scapualris, 1st rib mobs, upper trap inhibition taping PRN, thoracic mobility exercises, modalities, self rib mobs, I's T's, and Y's   PT Home Exercise Plan lower trap strengthening, book opening, upper cervical rotaion with 4 finger technique, SNAGS, pec stretch on foam roll.    Consulted and Agree with Plan of Care Patient      Patient will benefit from skilled therapeutic intervention in order to improve the following deficits and impairments:  Decreased activity tolerance, Decreased mobility, Decreased range of motion, Decreased strength, Hypomobility, Increased muscle spasms, Impaired flexibility, Impaired sensation, Impaired UE functional use  Visit Diagnosis: Cervicalgia  Pain in left arm  Muscle weakness (generalized)     Problem List Patient Active Problem List   Diagnosis Date Noted  . S/P hemorrhoidectomy 07/23/2012  . Hemorrhoids 05/14/2012  . Migraines 05/14/2012    [name],Ariyah PTA 07/24/2016, 4:24 PM  [facility] [address] [place], [place], [address] Phone: [phone]   Fax:  [phone]  Name: Anna Hobbs MRN: [id number] Date of Birth: [birth year]

## 2016-07-29 ENCOUNTER — Ambulatory Visit: Admitting: Physical Therapy

## 2016-07-29 DIAGNOSIS — M6281 Muscle weakness (generalized): Secondary | ICD-10-CM

## 2016-07-29 DIAGNOSIS — M79602 Pain in left arm: Secondary | ICD-10-CM

## 2016-07-29 DIAGNOSIS — M542 Cervicalgia: Secondary | ICD-10-CM

## 2016-07-29 NOTE — Therapy (Signed)
[facility] [address] [place], [place], [address] Phone: [phone]   Fax:  [phone]  Physical Therapy Treatment  Patient Details  Name: Anna Hobbs MRN: [id number] Date of Birth: Feb 19, 1966 Referring Provider: Delano Metzorrington, Kip  Encounter Date: 07/29/2016      PT End of Session - 07/29/16 1600    Visit Number 17   Number of Visits 23   Date for PT Re-Evaluation 09/16/16   PT Start Time 1503   PT Stop Time 1545   PT Time Calculation (min) 42 min   Activity Tolerance Patient tolerated treatment well   Behavior During Therapy [facility] for tasks assessed/performed      Past Medical History:  Diagnosis Date  . Depression   . Dysrhythmia    hx PVC  . Hemorrhoids   . IBS (irritable bowel syndrome)    ruling out  . Migraine     Past Surgical History:  Procedure Laterality Date  . BREAST SURGERY     needle core bx-bilat-neg  . CYSTECTOMY     cyst on neck  . HEMORRHOID SURGERY     Hemorrhoidectomy  . LAPAROSCOPY     to r/o endometriosis  . WISDOM TOOTH EXTRACTION      There were no vitals filed for this visit.      Subjective Assessment - 07/29/16 1504    Subjective No pain driving to PT.  Uses her right hand for reaching in deep.     Pain Score 0-No pain   Pain Location Shoulder   Pain Orientation Left   Pain Descriptors / Indicators Aching  deep ack with getting cold and hunching shoulders    Pain Radiating Towards no   Pain Frequency Intermittent   Aggravating Factors  bumping a heavy door  open with shoulder. (Rare) she uses he foot to assist,   reaching deep into a corner and lifting something out of a cabinet,  Bra strap digs in deep sometimes.     Pain Relieving Factors change of movement   Effect of Pain on Daily Activities uses foot to open door,  does not carry purse on left shoulder,                            [facility] Adult PT Treatment/Exercise - 07/29/16 0001      Neck  Exercises: Supine   Canalith Repositioning Limitations Otrher exercise cervical stabilization  with 3pounds, with head press; flexion/extension starting at 90,  horizontal abduction/ adduction with 3 LBS each X 20, tingling started wit flexion end range along flank.     Other Supine Exercise core 90/90 isometric 10 , both ,5 each double.   Other Supine Exercise serratus anterior  10 x 2 set serratus anterior each     Shoulder Exercises: ROM/Strengthening   UBE (Upper Arm Bike) L2 X 6 minutes  both directions,       Shoulder Exercises: Stretch   Corner Stretch 3 reps;30 seconds  door way     Manual Therapy   Manual therapy comments Inter tubucular groove tender to palpation, however no increased pain with elbow flexion. extension.  subscapularis soft tissue work tissue, softened,   Soft tissue mobilization scapular area, shoulder left neck and proximal arm , instrument assist, intermittantly.  softened.    Patient able to get full shoulder flexion without pull post  PT Short Term Goals - 07/08/16 1518      PT SHORT TERM GOAL #1   Title pt will be I with initial HEP   Time 3   Period Weeks   Status Achieved     PT SHORT TERM GOAL #2   Title pt will report decreased L UE radicular sxs with work duties x 25%   Time 3   Period Weeks   Status Achieved     PT SHORT TERM GOAL #3   Title pt will report decreased suboccipital tension headaches x 25%   Time 3   Period Weeks   Status Achieved     PT SHORT TERM GOAL #4   Title pt will report being able to hold her phone with 5/10 shoulder pain L   Time 3   Period Weeks   Status Achieved           PT Long Term Goals - 07/29/16 1611      PT LONG TERM GOAL #1   Title pt will be able to perform her work duties without L UE radicular sxs >/= 75% of the time   Baseline less frequent % not discussed   Time 6   Period Weeks   Status On-going     PT LONG TERM GOAL #2   Title pt will be able to hold  a grocery bag with her L hand with </= 3/10 L shoulder/cervical pain   Time 6   Period Weeks   Status Unable to assess     PT LONG TERM GOAL #3   Title pt will be able to position her purse strap across her L clavicle without pain   Baseline still painful,  wears strap on other side   Time 6   Period Weeks   Status On-going     PT LONG TERM GOAL #4   Title Pt will be able to sleep in L sidelying with 75% less pain   Time 6   Period Weeks   Status Unable to assess               Plan - 07/29/16 1602    Clinical Impression Statement Radiating less frequent.   Upper trap pain less post manual.  Anterior shoulder pain present at end of session, sore from manual.  Patient advised to wait a little longer to return to her [employer] exercise class.   PT Next Visit Plan 1st rib mobs, upper trap inhibition taping PRN, thoracic mobility exercises, modalities, self rib mobs, I's T's, and Y's   PT Home Exercise Plan lower trap strengthening, book opening, upper cervical rotaion with 4 finger technique, SNAGS, pec stretch on foam roll.    Consulted and Agree with Plan of Care Patient      Patient will benefit from skilled therapeutic intervention in order to improve the following deficits and impairments:  Decreased activity tolerance, Decreased mobility, Decreased range of motion, Decreased strength, Hypomobility, Increased muscle spasms, Impaired flexibility, Impaired sensation, Impaired UE functional use  Visit Diagnosis: Cervicalgia  Pain in left arm  Muscle weakness (generalized)     Problem List Patient Active Problem List   Diagnosis Date Noted  . S/P hemorrhoidectomy 07/23/2012  . Hemorrhoids 05/14/2012  . Migraines 05/14/2012    HARRIS,Ting PTA 07/29/2016, 4:13 PM  [facility] [address] [place], [place], [address] Phone: [phone]   Fax:  [phone]  Name: Anna Hobbs MRN: [id number] Date of Birth:  08/27/1966    

## 2016-08-01 ENCOUNTER — Ambulatory Visit: Admitting: Physical Therapy

## 2016-08-04 ENCOUNTER — Ambulatory Visit: Admitting: Physical Therapy

## 2016-08-04 DIAGNOSIS — M79602 Pain in left arm: Secondary | ICD-10-CM

## 2016-08-04 DIAGNOSIS — M542 Cervicalgia: Secondary | ICD-10-CM | POA: Diagnosis not present

## 2016-08-04 DIAGNOSIS — M6281 Muscle weakness (generalized): Secondary | ICD-10-CM

## 2016-08-04 NOTE — Therapy (Signed)
[facility] [address] [place], [place], [address] Phone: [phone]   Fax:  [phone]  Physical Therapy Treatment  Patient Details  Name: Anna Hobbs MRN: [id number] Date of Birth: April 05, 1966 Referring Provider: Delano Metzorrington, Kip  Encounter Date: 08/04/2016      PT End of Session - 08/04/16 1743    Visit Number 18   Number of Visits 23   Date for PT Re-Evaluation 09/16/16   Authorization Type selfpay   PT Start Time 1508   PT Stop Time 1557   PT Time Calculation (min) 49 min   Activity Tolerance Patient tolerated treatment well   Behavior During Therapy [facility] for tasks assessed/performed      Past Medical History:  Diagnosis Date  . Depression   . Dysrhythmia    hx PVC  . Hemorrhoids   . IBS (irritable bowel syndrome)    ruling out  . Migraine     Past Surgical History:  Procedure Laterality Date  . BREAST SURGERY     needle core bx-bilat-neg  . CYSTECTOMY     cyst on neck  . HEMORRHOID SURGERY     Hemorrhoidectomy  . LAPAROSCOPY     to r/o endometriosis  . WISDOM TOOTH EXTRACTION      There were no vitals filed for this visit.      Subjective Assessment - 08/04/16 1513    Subjective "I was doing pretty good until the dog laid on my shoulder and my arm went to sleep; still some soreness with reaching overhead"   Currently in Pain? No/denies                         [facility]/Exercise - 08/04/16 0001      Shoulder Exercises: Stretch   Corner Stretch 2 reps;30 seconds   Other Shoulder Stretches lat stretch 2 x 30 sec hold      Moist Heat Therapy   Number Minutes Moist Heat 15 Minutes   Moist Heat Location Shoulder;Cervical     Manual Therapy   Soft tissue mobilization scapular area, shoulder left neck and proximal arm , instrument assist, intermittantly.  softened.       Neck Exercises: Stretches   Upper Trapezius Stretch 2 reps;30 seconds   Levator  Stretch 2 reps;30 seconds   Other Neck Stretches scalene 2 x 30           Trigger Point Dry Needling - 08/04/16 1740    Consent Given? Yes   Education Handout Provided Yes  given previously   Muscles Treated Upper Body --  Latissimus Doris  on the L    Subscapularis Response Twitch response elicited;Palpable increased muscle length              PT Education - 08/04/16 1742    Education provided Yes   Education Details anatomy of the shoulder regardin latissimus doris and origin/ insertion as well as action/ stretching in reference to pt pain and location of pain.    Person(s) Educated Patient   Methods Explanation;Verbal cues   Comprehension Verbalized understanding;Verbal cues required          PT Short Term Goals - 07/08/16 1518      PT SHORT TERM GOAL #1   Title pt will be I with initial HEP   Time 3   Period Weeks   Status Achieved     PT SHORT TERM GOAL #2   Title pt  will report decreased L UE radicular sxs with work duties x 25%   Time 3   Period Weeks   Status Achieved     PT SHORT TERM GOAL #3   Title pt will report decreased suboccipital tension headaches x 25%   Time 3   Period Weeks   Status Achieved     PT SHORT TERM GOAL #4   Title pt will report being able to hold her phone with 5/10 shoulder pain L   Time 3   Period Weeks   Status Achieved           PT Long Term Goals - 07/29/16 1611      PT LONG TERM GOAL #1   Title pt will be able to perform her work duties without L UE radicular sxs >/= 75% of the time   Baseline less frequent % not discussed   Time 6   Period Weeks   Status On-going     PT LONG TERM GOAL #2   Title pt will be able to hold a grocery bag with her L hand with </= 3/10 L shoulder/cervical pain   Time 6   Period Weeks   Status Unable to assess     PT LONG TERM GOAL #3   Title pt will be able to position her purse strap across her L clavicle without pain   Baseline still painful,  wears strap on other side    Time 6   Period Weeks   Status On-going     PT LONG TERM GOAL #4   Title Pt will be able to sleep in L sidelying with 75% less pain   Time 6   Period Weeks   Status Unable to assess               Plan - 08/04/16 1744    Clinical Impression Statement pt continues to report improvement with intermittent pain with overhead activity. DN was performed on the sub-scapularis and latissmus doris which reproduced her symptoms she was feeling. following soft tissue work and stretching she was able to reach over head with only soreness from the DN with significant relief of pain.    PT Next Visit Plan  scapular stabilizers, assess latissimus doris, sub-scap and surrounding musculature, manual    PT Home Exercise Plan lower trap strengthening, book opening, upper cervical rotaion with 4 finger technique, SNAGS, pec stretch on foam roll.    Consulted and Agree with Plan of Care Patient      Patient will benefit from skilled therapeutic intervention in order to improve the following deficits and impairments:  Decreased activity tolerance, Decreased mobility, Decreased range of motion, Decreased strength, Hypomobility, Increased muscle spasms, Impaired flexibility, Impaired sensation, Impaired UE functional use  Visit Diagnosis: Cervicalgia  Pain in left arm  Muscle weakness (generalized)     Problem List Patient Active Problem List   Diagnosis Date Noted  . S/P hemorrhoidectomy 07/23/2012  . Hemorrhoids 05/14/2012  . Migraines 05/14/2012   Lulu RidingKristoffer Jeaneen Cala PT, DPT, LAT, ATC  08/04/16  5:51 PM      [facility] [address] [place], [place], [address] Phone: [phone]   Fax:  [phone]  Name: Anna Hobbs MRN: [id number] Date of Birth: 1966-05-19

## 2016-08-07 ENCOUNTER — Ambulatory Visit: Admitting: Physical Therapy

## 2016-08-07 DIAGNOSIS — M79602 Pain in left arm: Secondary | ICD-10-CM

## 2016-08-07 DIAGNOSIS — M542 Cervicalgia: Secondary | ICD-10-CM | POA: Diagnosis not present

## 2016-08-07 DIAGNOSIS — M6281 Muscle weakness (generalized): Secondary | ICD-10-CM

## 2016-08-07 NOTE — Patient Instructions (Signed)
From exercise drawer [place].   10 x each 1 x a day, 3 x a week.

## 2016-08-07 NOTE — Therapy (Signed)
[facility] [address] [place], [place], [address] Phone: [phone]   Fax:  [phone]  Physical Therapy Treatment  Patient Details  Name: Anna FennelKaren A Hobbs MRN: [id number] Date of Birth: 1965/12/02 Referring Provider: Delano Metzorrington, Kip  Encounter Date: 08/07/2016      PT End of Session - 08/07/16 1628    Visit Number 19   Number of Visits 23   Date for PT Re-Evaluation 09/16/16   PT Start Time 1533   PT Stop Time 1620   PT Time Calculation (min) 47 min   Activity Tolerance Patient tolerated treatment well   Behavior During Therapy [facility] for tasks assessed/performed      Past Medical History:  Diagnosis Date  . Depression   . Dysrhythmia    hx PVC  . Hemorrhoids   . IBS (irritable bowel syndrome)    ruling out  . Migraine     Past Surgical History:  Procedure Laterality Date  . BREAST SURGERY     needle core bx-bilat-neg  . CYSTECTOMY     cyst on neck  . HEMORRHOID SURGERY     Hemorrhoidectomy  . LAPAROSCOPY     to r/o endometriosis  . WISDOM TOOTH EXTRACTION      There were no vitals filed for this visit.      Subjective Assessment - 08/07/16 1528    Subjective Less tincgling, pain ara shrinking, Pain increases mostly at the end of the day.   3/10 now.     Currently in Pain? Yes   Pain Score 3    Pain Location Shoulder   Pain Orientation Left   Pain Descriptors / Indicators Aching   Pain Type Chronic pain   Pain Radiating Towards arm left   Pain Frequency Intermittent   Aggravating Factors  reaching overhead   Pain Relieving Factors Dry needle   Effect of Pain on Daily Activities does not carry  purse on shoulder                         [facility] Adult PT Treatment/Exercise - 08/07/16 0001      Neck Exercises: Prone   Other Prone Exercise "T", "Y" thumbs up, out 10 X each.  Timing differences of scapula noticed with left initiating early.  Deviations continued with "Y" thumb up  with tingling and form change.  10 x each ,istructions intermittantly     Manual Therapy   Manual Therapy Soft tissue mobilization;Passive ROM  and triggerpoint release.     Passive ROM Left scapula, shoulder in prone with manual as above. grade 2 joint mobilization for relaxation.  upper trap, latissimus subscapularis continue to be symptomatic with manual.      Also UBE each way 3 minutes level 2.          PT Education - 08/07/16 1627    Education provided Yes   Education Details exercise   Person(s) Educated Patient   Methods Explanation;Demonstration;Tactile cues;Verbal cues;Handout   Comprehension Verbalized understanding;Returned demonstration          PT Short Term Goals - 07/08/16 1518      PT SHORT TERM GOAL #1   Title pt will be I with initial HEP   Time 3   Period Weeks   Status Achieved     PT SHORT TERM GOAL #2   Title pt will report decreased L UE radicular sxs with work duties x 25%   Time 3   Period  Weeks   Status Achieved     PT SHORT TERM GOAL #3   Title pt will report decreased suboccipital tension headaches x 25%   Time 3   Period Weeks   Status Achieved     PT SHORT TERM GOAL #4   Title pt will report being able to hold her phone with 5/10 shoulder pain L   Time 3   Period Weeks   Status Achieved           PT Long Term Goals - 08/07/16 1636      PT LONG TERM GOAL #1   Title pt will be able to perform her work duties without L UE radicular sxs >/= 75% of the time   Baseline less frequent % not discussed , radiates mostly after work   Time 6   Period Weeks   Status On-going     PT LONG TERM GOAL #2   Title pt will be able to hold a grocery bag with her L hand with </= 3/10 L shoulder/cervical pain   Time 6   Period Weeks   Status Unable to assess     PT LONG TERM GOAL #3   Title pt will be able to position her purse strap across her L clavicle without pain   Baseline still painful,  wears strap on other side   Time 6    Period Weeks   Status On-going     PT LONG TERM GOAL #4   Title Pt will be able to sleep in L sidelying with 75% less pain   Time 6   Period Weeks   Status Unable to assess               Plan - 08/07/16 1630    Clinical Impression Statement Patient feels stretched after session,  full flexion with increased comfort.   PT Next Visit Plan manual, review hughston stretch. Check answers to specific goals.    PT Home Exercise Plan lower trap strengthening, book opening, upper cervical rotaion with 4 finger technique, SNAGS, pec stretch on foam roll. 11/16 [place]   Consulted and Agree with Plan of Care Patient      Patient will benefit from skilled therapeutic intervention in order to improve the following deficits and impairments:  Decreased activity tolerance, Decreased mobility, Decreased range of motion, Decreased strength, Hypomobility, Increased muscle spasms, Impaired flexibility, Impaired sensation, Impaired UE functional use  Visit Diagnosis: Cervicalgia  Pain in left arm  Muscle weakness (generalized)     Problem List Patient Active Problem List   Diagnosis Date Noted  . S/P hemorrhoidectomy 07/23/2012  . Hemorrhoids 05/14/2012  . Migraines 05/14/2012    HARRIS,Ellason PTA 08/07/2016, 4:39 PM  [facility] [address] [place], [place], [address] Phone: [phone]   Fax:  [phone]  Name: Anna FennelKaren A Hobbs MRN: [id number] Date of Birth: 1966/07/07

## 2016-08-11 ENCOUNTER — Ambulatory Visit: Admitting: Physical Therapy

## 2016-08-11 DIAGNOSIS — M6281 Muscle weakness (generalized): Secondary | ICD-10-CM

## 2016-08-11 DIAGNOSIS — M79602 Pain in left arm: Secondary | ICD-10-CM

## 2016-08-11 DIAGNOSIS — M542 Cervicalgia: Secondary | ICD-10-CM

## 2016-08-11 NOTE — Therapy (Signed)
[facility] [address] [place], [place], [address] Phone: [phone]   Fax:  [phone]  Physical Therapy Treatment  Patient Details  Name: Anna FennelKaren A Hobbs MRN: [id number] Date of Birth: Jun 18, 1966 Referring Provider: Delano Metzorrington, Kip  Encounter Date: 08/11/2016      PT End of Session - 08/11/16 1737    Visit Number 20   Number of Visits 23   Date for PT Re-Evaluation 09/16/16   PT Start Time 1546   PT Stop Time 1630   PT Time Calculation (min) 44 min   Activity Tolerance Patient tolerated treatment well   Behavior During Therapy [facility] for tasks assessed/performed      Past Medical History:  Diagnosis Date  . Depression   . Dysrhythmia    hx PVC  . Hemorrhoids   . IBS (irritable bowel syndrome)    ruling out  . Migraine     Past Surgical History:  Procedure Laterality Date  . BREAST SURGERY     needle core bx-bilat-neg  . CYSTECTOMY     cyst on neck  . HEMORRHOID SURGERY     Hemorrhoidectomy  . LAPAROSCOPY     to r/o endometriosis  . WISDOM TOOTH EXTRACTION      There were no vitals filed for this visit.      Subjective Assessment - 08/11/16 1544    Subjective "My shoulder has really been hurting the last couple days, but have been doing more while hanging lights and lots of decorating. having increased soreness with the coldness"    Currently in Pain? Yes   Pain Score 4    Pain Location Shoulder   Pain Orientation Left   Pain Type Chronic pain   Pain Onset More than a month ago   Pain Frequency Intermittent                         [facility] Adult PT Treatment/Exercise - 08/11/16 0001      Shoulder Exercises: Seated   Other Seated Exercises lower trap strengthening 2 x 15   red theraband     Shoulder Exercises: ROM/Strengthening   Other ROM/Strengthening Exercises lat pull down 2 x 10 25#     Shoulder Exercises: Stretch   Corner Stretch 2 reps;30 seconds     Manual Therapy    Soft tissue mobilization IASTM over the L teres Major and Latissimus doris          Trigger Point Dry Needling - 08/11/16 1552    Consent Given? Yes   Education Handout Provided Yes   Muscles Treated Upper Body --  latissimus dorsi, Teres Major              PT Education - 08/11/16 1736    Education provided Yes   Education Details chronic pain education regarding nerve and brain involvement, education of muscle tightness and visual cues of which muscles are involved with referral information   Person(s) Educated Patient   Methods Explanation;Verbal cues;Handout   Comprehension Verbalized understanding;Verbal cues required          PT Short Term Goals - 07/08/16 1518      PT SHORT TERM GOAL #1   Title pt will be I with initial HEP   Time 3   Period Weeks   Status Achieved     PT SHORT TERM GOAL #2   Title pt will report decreased L UE radicular sxs with work duties x 25%  Time 3   Period Weeks   Status Achieved     PT SHORT TERM GOAL #3   Title pt will report decreased suboccipital tension headaches x 25%   Time 3   Period Weeks   Status Achieved     PT SHORT TERM GOAL #4   Title pt will report being able to hold her phone with 5/10 shoulder pain L   Time 3   Period Weeks   Status Achieved           PT Long Term Goals - 08/07/16 1636      PT LONG TERM GOAL #1   Title pt will be able to perform her work duties without L UE radicular sxs >/= 75% of the time   Baseline less frequent % not discussed , radiates mostly after work   Time 6   Period Weeks   Status On-going     PT LONG TERM GOAL #2   Title pt will be able to hold a grocery bag with her L hand with </= 3/10 L shoulder/cervical pain   Time 6   Period Weeks   Status Unable to assess     PT LONG TERM GOAL #3   Title pt will be able to position her purse strap across her L clavicle without pain   Baseline still painful,  wears strap on other side   Time 6   Period Weeks   Status  On-going     PT LONG TERM GOAL #4   Title Pt will be able to sleep in L sidelying with 75% less pain   Time 6   Period Weeks   Status Unable to assess               Plan - 08/11/16 1738    Clinical Impression Statement Mrs. Pilson reports pain at 4/10 with tightness continued in the latissimus region on the L. continued DN of the teres major and latissumus dorsi with significant twitching and muscle lengthening. following soft tissue work adn stretching pt was able to do strengthening exercises with minimal complaint of pain and tightness. pt declined MHP post session.    PT Next Visit Plan assess response to DN, scapular stabilizer strengthening, lower trap strengthening, stretchong of lats and teres major. pec stretching. ROM, goals,    Consulted and Agree with Plan of Care Patient      Patient will benefit from skilled therapeutic intervention in order to improve the following deficits and impairments:  Decreased activity tolerance, Decreased mobility, Decreased range of motion, Decreased strength, Hypomobility, Increased muscle spasms, Impaired flexibility, Impaired sensation, Impaired UE functional use  Visit Diagnosis: Cervicalgia  Pain in left arm  Muscle weakness (generalized)     Problem List Patient Active Problem List   Diagnosis Date Noted  . S/P hemorrhoidectomy 07/23/2012  . Hemorrhoids 05/14/2012  . Migraines 05/14/2012   Lulu RidingKristoffer Alain Deschene PT, DPT, LAT, ATC  08/11/16  5:42 PM      [facility] [address] [place], [place], [address] Phone: [phone]   Fax:  [phone]  Name: Anna FennelKaren A Hobbs MRN: [id number] Date of Birth: May 08, 1966

## 2016-08-18 ENCOUNTER — Ambulatory Visit: Admitting: Physical Therapy

## 2016-08-21 ENCOUNTER — Ambulatory Visit: Admitting: Physical Therapy

## 2016-08-27 ENCOUNTER — Ambulatory Visit: Attending: Family Medicine | Admitting: Physical Therapy

## 2016-08-27 DIAGNOSIS — M6281 Muscle weakness (generalized): Secondary | ICD-10-CM

## 2016-08-27 DIAGNOSIS — M542 Cervicalgia: Secondary | ICD-10-CM | POA: Diagnosis present

## 2016-08-27 DIAGNOSIS — M79602 Pain in left arm: Secondary | ICD-10-CM | POA: Insufficient documentation

## 2016-08-27 NOTE — Therapy (Signed)
[facility] [address] [place], [place], [address] Phone: [phone]   Fax:  [phone]  Physical Therapy Treatment  Patient Details  Name: Anna Hobbs MRN: [id number] Date of Birth: 26-Oct-1965 Referring Provider: Delano Metzorrington, Kip  Encounter Date: 08/27/2016      PT End of Session - 08/27/16 1450    Visit Number 21   Number of Visits 23   Date for PT Re-Evaluation 09/16/16   Authorization Type selfpay   PT Start Time 1415   PT Stop Time 1505   PT Time Calculation (min) 50 min   Activity Tolerance Patient tolerated treatment well   Behavior During Therapy [facility] for tasks assessed/performed      Past Medical History:  Diagnosis Date  . Depression   . Dysrhythmia    hx PVC  . Hemorrhoids   . IBS (irritable bowel syndrome)    ruling out  . Migraine     Past Surgical History:  Procedure Laterality Date  . BREAST SURGERY     needle core bx-bilat-neg  . CYSTECTOMY     cyst on neck  . HEMORRHOID SURGERY     Hemorrhoidectomy  . LAPAROSCOPY     to r/o endometriosis  . WISDOM TOOTH EXTRACTION      There were no vitals filed for this visit.      Subjective Assessment - 08/27/16 1421    Subjective "I've been having increased pain and soreness soreness the last 5 days, I could be be doing too much but it has been really giving me issue"   Currently in Pain? Yes   Pain Score 6    Pain Location Shoulder   Pain Orientation Left   Pain Descriptors / Indicators Aching   Pain Type Chronic pain   Pain Onset More than a month ago   Pain Frequency Intermittent   Aggravating Factors  working,                          [facility] Treatment/Exercise - 08/27/16 1449      Traction   Type of Traction Cervical   Min (lbs) 7   Max (lbs) 14   Hold Time 60   Rest Time 10   Time 15     Manual Therapy   Manual therapy comments manual trigger point release of upper trap x 3   Joint Mobilization  C3-C6 PA grade 3 mobs, L lateral gapping mobs grade 3 C3-C6   Passive ROM cervical flexion with l rotation with distraction and oscillations.                 PT Education - 08/27/16 1457    Education provided Yes   Education Details anatomy of the cervical spine, benefits of mobilizations and cervical traction.    Person(s) Educated Patient   Methods Explanation;Verbal cues   Comprehension Verbalized understanding;Verbal cues required          PT Short Term Goals - 07/08/16 1518      PT SHORT TERM GOAL #1   Title pt will be I with initial HEP   Time 3   Period Weeks   Status Achieved     PT SHORT TERM GOAL #2   Title pt will report decreased L UE radicular sxs with work duties x 25%   Time 3   Period Weeks   Status Achieved     PT SHORT TERM GOAL #3   Title  pt will report decreased suboccipital tension headaches x 25%   Time 3   Period Weeks   Status Achieved     PT SHORT TERM GOAL #4   Title pt will report being able to hold her phone with 5/10 shoulder pain L   Time 3   Period Weeks   Status Achieved           PT Long Term Goals - 08/07/16 1636      PT LONG TERM GOAL #1   Title pt will be able to perform her work duties without L UE radicular sxs >/= 75% of the time   Baseline less frequent % not discussed , radiates mostly after work   Time 6   Period Weeks   Status On-going     PT LONG TERM GOAL #2   Title pt will be able to hold a grocery bag with her L hand with </= 3/10 L shoulder/cervical pain   Time 6   Period Weeks   Status Unable to assess     PT LONG TERM GOAL #3   Title pt will be able to position her purse strap across her L clavicle without pain   Baseline still painful,  wears strap on other side   Time 6   Period Weeks   Status On-going     PT LONG TERM GOAL #4   Title Pt will be able to sleep in L sidelying with 75% less pain   Time 6   Period Weeks   Status Unable to assess               Plan - 08/27/16  1458    Clinical Impression Statement Mrs. Baisley continues to have fluctuating pain rated at 6/10 pain today. educated about anatomy of the cerivcal spine and biomechanics. worked on cervical mobilizations to provided relief. Discussed at length with pt that due to reoccuring pain and muscle tightness that if no progress is seen within the next few visits we may need to refer her back to her MD which she demonstrated understanding.    PT Next Visit Plan assess response traction, cervical mobilization, DN of the lats and sub-scapularis, mechanical traction.       Patient will benefit from skilled therapeutic intervention in order to improve the following deficits and impairments:  Decreased activity tolerance, Decreased mobility, Decreased range of motion, Decreased strength, Hypomobility, Increased muscle spasms, Impaired flexibility, Impaired sensation, Impaired UE functional use  Visit Diagnosis: Cervicalgia  Pain in left arm  Muscle weakness (generalized)     Problem List Patient Active Problem List   Diagnosis Date Noted  . S/P hemorrhoidectomy 07/23/2012  . Hemorrhoids 05/14/2012  . Migraines 05/14/2012   Lulu RidingKristoffer Mariachristina Holle PT, DPT, LAT, ATC  08/27/16  3:02 PM      [facility] [address] [place], [place], [address] Phone: [phone]   Fax:  [phone]  Name: Anna Hobbs MRN: [id number] Date of Birth: [birth year]

## 2016-09-03 ENCOUNTER — Ambulatory Visit: Admitting: Physical Therapy

## 2016-09-03 DIAGNOSIS — M6281 Muscle weakness (generalized): Secondary | ICD-10-CM

## 2016-09-03 DIAGNOSIS — M542 Cervicalgia: Secondary | ICD-10-CM

## 2016-09-03 DIAGNOSIS — M79602 Pain in left arm: Secondary | ICD-10-CM

## 2016-09-03 NOTE — Therapy (Signed)
[facility] [address] [place], [place], [address] Phone: [phone]   Fax:  [phone]  Physical Therapy Treatment  Patient Details  Name: Anna Hobbs MRN: [id number] Date of Birth: June 13, 1966 Referring Provider: Delano Metzorrington, Kip  Encounter Date: 09/03/2016      PT End of Session - 09/03/16 1236    Visit Number 22   Number of Visits 23   Date for PT Re-Evaluation 09/16/16   PT Start Time 1146   PT Stop Time 1235   PT Time Calculation (min) 49 min   Activity Tolerance Patient tolerated treatment well   Behavior During Therapy [facility] for tasks assessed/performed      Past Medical History:  Diagnosis Date  . Depression   . Dysrhythmia    hx PVC  . Hemorrhoids   . IBS (irritable bowel syndrome)    ruling out  . Migraine     Past Surgical History:  Procedure Laterality Date  . BREAST SURGERY     needle core bx-bilat-neg  . CYSTECTOMY     cyst on neck  . HEMORRHOID SURGERY     Hemorrhoidectomy  . LAPAROSCOPY     to r/o endometriosis  . WISDOM TOOTH EXTRACTION      There were no vitals filed for this visit.      Subjective Assessment - 09/03/16 1150    Subjective "I am still hurting pretty bad, and it is catching in the back of the neck" pt report feeling good,    Currently in Pain? Yes   Pain Score 5    Pain Orientation Left   Pain Descriptors / Indicators Aching;Tightness   Pain Type Chronic pain   Pain Onset More than a month ago   Pain Frequency Intermittent   Aggravating Factors  working, reaching    Pain Relieving Factors dry needling, stretching, manual.                          [facility] Adult PT Treatment/Exercise - 09/03/16 0001      Shoulder Exercises: Stretch   Other Shoulder Stretches supine latissimus dorsi stretch 3 x 30 sec hold  with contract/ relax holding 10 sec     Traction   Type of Traction Cervical   Min (lbs) 7   Max (lbs) 14   Hold Time 60   Rest Time  10   Time 10     Manual Therapy   Manual therapy comments manual trigger point release of scalenes on the L   Joint Mobilization C3-C6 PA grade 3 mobs, L lateral gapping mobs grade 3 C3-C6   Passive ROM cervical flexion with l rotation with distraction and oscillations.                   PT Short Term Goals - 07/08/16 1518      PT SHORT TERM GOAL #1   Title pt will be I with initial HEP   Time 3   Period Weeks   Status Achieved     PT SHORT TERM GOAL #2   Title pt will report decreased L UE radicular sxs with work duties x 25%   Time 3   Period Weeks   Status Achieved     PT SHORT TERM GOAL #3   Title pt will report decreased suboccipital tension headaches x 25%   Time 3   Period Weeks   Status Achieved     PT SHORT TERM  GOAL #4   Title pt will report being able to hold her phone with 5/10 shoulder pain L   Time 3   Period Weeks   Status Achieved           PT Long Term Goals - 08/07/16 1636      PT LONG TERM GOAL #1   Title pt will be able to perform her work duties without L UE radicular sxs >/= 75% of the time   Baseline less frequent % not discussed , radiates mostly after work   Time 6   Period Weeks   Status On-going     PT LONG TERM GOAL #2   Title pt will be able to hold a grocery bag with her L hand with </= 3/10 L shoulder/cervical pain   Time 6   Period Weeks   Status Unable to assess     PT LONG TERM GOAL #3   Title pt will be able to position her purse strap across her L clavicle without pain   Baseline still painful,  wears strap on other side   Time 6   Period Weeks   Status On-going     PT LONG TERM GOAL #4   Title Pt will be able to sleep in L sidelying with 75% less pain   Time 6   Period Weeks   Status Unable to assess               Plan - 09/03/16 1234    Clinical Impression Statement pt reports pain at 5/10. focused DN of the latissimus Dorsi on the L, followed with cervical mobs she reported pain dropped to  2/10. continued cervical mechanical traction which she reported relief of tightness.    PT Next Visit Plan assess response traction, cervical mobilization, DN of the lats and sub-scapularis, mechanical traction.    PT Home Exercise Plan lower trap strengthening, book opening, upper cervical rotaion with 4 finger technique, SNAGS, pec stretch on foam roll. 11/16 [place]   Consulted and Agree with Plan of Care Patient      Patient will benefit from skilled therapeutic intervention in order to improve the following deficits and impairments:  Decreased activity tolerance, Decreased mobility, Decreased range of motion, Decreased strength, Hypomobility, Increased muscle spasms, Impaired flexibility, Impaired sensation, Impaired UE functional use  Visit Diagnosis: Cervicalgia  Pain in left arm  Muscle weakness (generalized)     Problem List Patient Active Problem List   Diagnosis Date Noted  . S/P hemorrhoidectomy 07/23/2012  . Hemorrhoids 05/14/2012  . Migraines 05/14/2012   Lulu RidingKristoffer Jaeger Trueheart PT, DPT, LAT, ATC  09/03/16  12:37 PM      [facility] [address] [place], [place], [address] Phone: [phone]   Fax:  [phone]  Name: Anna Hobbs MRN: [id number] Date of Birth: [birth year]

## 2016-09-09 ENCOUNTER — Ambulatory Visit: Admitting: Physical Therapy

## 2016-09-09 DIAGNOSIS — M542 Cervicalgia: Secondary | ICD-10-CM | POA: Diagnosis not present

## 2016-09-09 DIAGNOSIS — M79602 Pain in left arm: Secondary | ICD-10-CM

## 2016-09-09 DIAGNOSIS — M6281 Muscle weakness (generalized): Secondary | ICD-10-CM

## 2016-09-09 NOTE — Therapy (Signed)
[facility] [address] [place], [place], [address] Phone: [phone]   Fax:  [phone]  Physical Therapy Treatment / Re-Certification  Patient Details  Name: Anna Hobbs MRN: [id number] Date of Birth: Sep 26, 1965 Referring Provider: Delano Metzorrington, Kip  Encounter Date: 09/09/2016      PT End of Session - 09/09/16 1625    Visit Number 23   Number of Visits 27   Date for PT Re-Evaluation 10/14/16   PT Start Time 1546   PT Stop Time 1634   PT Time Calculation (min) 48 min   Activity Tolerance Patient tolerated treatment well   Behavior During Therapy [facility] for tasks assessed/performed      Past Medical History:  Diagnosis Date  . Depression   . Dysrhythmia    hx PVC  . Hemorrhoids   . IBS (irritable bowel syndrome)    ruling out  . Migraine     Past Surgical History:  Procedure Laterality Date  . BREAST SURGERY     needle core bx-bilat-neg  . CYSTECTOMY     cyst on neck  . HEMORRHOID SURGERY     Hemorrhoidectomy  . LAPAROSCOPY     to r/o endometriosis  . WISDOM TOOTH EXTRACTION      There were no vitals filed for this visit.      Subjective Assessment - 09/09/16 1544    Subjective " I am feeling okay just tired from work"    Currently in Pain? Yes   Pain Score 1    Pain Location Shoulder   Pain Orientation Left   Pain Descriptors / Indicators Aching;Tightness   Pain Type Chronic pain   Pain Onset More than a month ago   Pain Frequency Intermittent   Aggravating Factors  lifting overhead with the LUE, keeping arm in position for too long   Pain Relieving Factors stretching, getting out of position.             [facility] Assessment - 09/09/16 0001      AROM   Cervical Flexion 68   Cervical Extension 70   Cervical - Right Side Bend 34   Cervical - Left Side Bend 42   Cervical - Right Rotation 80   Cervical - Left Rotation 78                     [facility] Adult PT  Treatment/Exercise - 09/09/16 0001      Shoulder Exercises: Seated   Other Seated Exercises upper cervical rotation using hands against the chest     Traction   Type of Traction Cervical   Min (lbs) 7   Max (lbs) 14   Hold Time 60   Rest Time 10   Time 10     Manual Therapy   Manual therapy comments manual trigger point release of L upper trap x 3, L cervical paraspinals x 3   Joint Mobilization C3-C6 PA grade 3 mobs, L lateral gapping mobs grade 3 C3-C6   Passive ROM cervical flexion with L rotation with distraction and oscillations.                 PT Education - 09/09/16 1635    Education provided Yes   Education Details reviewing prevously given HEP, updated POC   Person(s) Educated Patient   Methods Explanation;Verbal cues   Comprehension Verbalized understanding;Verbal cues required          PT Short Term Goals - 07/08/16 1518  PT SHORT TERM GOAL #1   Title pt will be I with initial HEP   Time 3   Period Weeks   Status Achieved     PT SHORT TERM GOAL #2   Title pt will report decreased L UE radicular sxs with work duties x 25%   Time 3   Period Weeks   Status Achieved     PT SHORT TERM GOAL #3   Title pt will report decreased suboccipital tension headaches x 25%   Time 3   Period Weeks   Status Achieved     PT SHORT TERM GOAL #4   Title pt will report being able to hold her phone with 5/10 shoulder pain L   Time 3   Period Weeks   Status Achieved           PT Long Term Goals - 09/09/16 1640      PT LONG TERM GOAL #1   Title pt will be able to perform her work duties without L UE radicular sxs >/= 75% of the time   Time 6   Period Weeks   Status On-going     PT LONG TERM GOAL #2   Title pt will be able to hold a grocery bag with her L hand with </= 3/10 L shoulder/cervical pain   Period Weeks   Status On-going     PT LONG TERM GOAL #3   Title pt will be able to position her purse strap across her L clavicle without pain    Time 6   Period Weeks   Status On-going     PT LONG TERM GOAL #4   Title Pt will be able to sleep in L sidelying with 75% less pain   Time 6   Period Weeks   Status On-going               Plan - 09/09/16 1625    Clinical Impression Statement Anna Hobbs reports only 1/10 pain today and demonstrates improvement in cervical mobility. Focused on cervical mobility via mobs, manual trigger point release. plan to continue with current POC to work 1 x week for the next 4 weeks.    PT Frequency 1x / week   PT Duration 4 weeks   PT Treatment/Interventions ADLs/Self Care Home Management;Cryotherapy;Electrical Stimulation;Iontophoresis 4mg /ml Dexamethasone;Moist Heat;Traction;Ultrasound;Functional mobility training;Patient/family education;Neuromuscular re-education;Therapeutic exercise;Therapeutic activities;Manual techniques;Passive range of motion;Taping;Dry needling   PT Next Visit Plan assess response traction, cervical mobilization, DN of the lats and sub-scapularis, mechanical traction.    PT Home Exercise Plan lower trap strengthening, book opening, upper cervical rotaion with 4 finger technique, SNAGS, pec stretch on foam roll. 11/16 [place]   Consulted and Agree with Plan of Care Patient      Patient will benefit from skilled therapeutic intervention in order to improve the following deficits and impairments:  Decreased activity tolerance, Decreased mobility, Decreased range of motion, Decreased strength, Hypomobility, Increased muscle spasms, Impaired flexibility, Impaired sensation, Impaired UE functional use  Visit Diagnosis: Cervicalgia  Pain in left arm  Muscle weakness (generalized)     Problem List Patient Active Problem List   Diagnosis Date Noted  . S/P hemorrhoidectomy 07/23/2012  . Hemorrhoids 05/14/2012  . Migraines 05/14/2012   Lulu Riding PT, DPT, LAT, ATC  09/09/16  4:43 PM      [facility] [address] [place], [place], [address] Phone: [phone]   Fax:  [phone]  Name: Anna Hobbs MRN: [id number]  Date of Birth: 12/15/1965

## 2016-09-19 ENCOUNTER — Ambulatory Visit: Admitting: Physical Therapy

## 2016-09-19 DIAGNOSIS — M542 Cervicalgia: Secondary | ICD-10-CM | POA: Diagnosis not present

## 2016-09-19 DIAGNOSIS — M79602 Pain in left arm: Secondary | ICD-10-CM

## 2016-09-19 DIAGNOSIS — M6281 Muscle weakness (generalized): Secondary | ICD-10-CM

## 2016-09-19 NOTE — Therapy (Signed)
[facility] [address] [place], [place], [address] Phone: [phone]   Fax:  [phone]  Physical Therapy Treatment  Patient Details  Name: Anna Hobbs MRN: [id number] Date of Birth: 1966-01-19 Referring Provider: Delano Metzorrington, Kip  Encounter Date: 09/19/2016      PT End of Session - 09/19/16 0913    Visit Number 24   Number of Visits 27   Date for PT Re-Evaluation 10/14/16   Authorization Type selfpay   PT Start Time 0801   PT Stop Time 0855   PT Time Calculation (min) 54 min   Activity Tolerance Patient tolerated treatment well   Behavior During Therapy [facility] for tasks assessed/performed      Past Medical History:  Diagnosis Date  . Depression   . Dysrhythmia    hx PVC  . Hemorrhoids   . IBS (irritable bowel syndrome)    ruling out  . Migraine     Past Surgical History:  Procedure Laterality Date  . BREAST SURGERY     needle core bx-bilat-neg  . CYSTECTOMY     cyst on neck  . HEMORRHOID SURGERY     Hemorrhoidectomy  . LAPAROSCOPY     to r/o endometriosis  . WISDOM TOOTH EXTRACTION      There were no vitals filed for this visit.      Subjective Assessment - 09/19/16 0804    Subjective "The past 2 days the neck and shoulder have been really bad, any pressure on my collar bone made it hurt really bad"    Currently in Pain? Yes   Pain Score 7    Pain Location Shoulder   Pain Orientation Left   Pain Descriptors / Indicators Aching;Pins and needles;Tightness;Stabbing   Pain Type Chronic pain   Pain Onset More than a month ago   Pain Frequency Intermittent   Aggravating Factors  lifting the arm over head, carrying items.    Pain Relieving Factors stretching, getting out of position and resting                         [facility] Treatment/Exercise - 09/19/16 0001      Modalities   Modalities Electrical Stimulation     Manual Therapy   Joint Mobilization C3-C6 PA grade 3  mobs, L lateral gapping mobs grade 3 C3-C6, L 1st rib mobs grade 3 with pt breathing in and out, L clavicle mobs grade 3 proximal and distal inferior/ posterior directions.           Trigger Point Dry Needling - 09/19/16 [id number]    Consent Given? Yes   Education Handout Provided --  given previously   Muscles Treated Upper Body --  sub-clavius, Scalenes on the R                PT Short Term Goals - 07/08/16 1518      PT SHORT TERM GOAL #1   Title pt will be I with initial HEP   Time 3   Period Weeks   Status Achieved     PT SHORT TERM GOAL #2   Title pt will report decreased L UE radicular sxs with work duties x 25%   Time 3   Period Weeks   Status Achieved     PT SHORT TERM GOAL #3   Title pt will report decreased suboccipital tension headaches x 25%   Time 3   Period Weeks   Status Achieved  PT SHORT TERM GOAL #4   Title pt will report being able to hold her phone with 5/10 shoulder pain L   Time 3   Period Weeks   Status Achieved           PT Long Term Goals - 09/09/16 1640      PT LONG TERM GOAL #1   Title pt will be able to perform her work duties without L UE radicular sxs >/= 75% of the time   Time 6   Period Weeks   Status On-going     PT LONG TERM GOAL #2   Title pt will be able to hold a grocery bag with her L hand with </= 3/10 L shoulder/cervical pain   Period Weeks   Status On-going     PT LONG TERM GOAL #3   Title pt will be able to position her purse strap across her L clavicle without pain   Time 6   Period Weeks   Status On-going     PT LONG TERM GOAL #4   Title Pt will be able to sleep in L sidelying with 75% less pain   Time 6   Period Weeks   Status On-going               Plan - 09/19/16 0913    Clinical Impression Statement pt reported some relief of pain after the last session that lasted until the Wed/ Thursday with increased pain with carrying activities. Focused on manual to increase first rib and  clavicle mobility as well as cervical spine following DN of the L scalenes and sub-clavius. utilized E-stim and MHP for pain post session.   PT Next Visit Plan assess response to DN, thoracic outlet??cervical mobilization, DN of the lats and sub-scapularis, mechanical traction.    Consulted and Agree with Plan of Care Patient      Patient will benefit from skilled therapeutic intervention in order to improve the following deficits and impairments:  Decreased activity tolerance, Decreased mobility, Decreased range of motion, Decreased strength, Hypomobility, Increased muscle spasms, Impaired flexibility, Impaired sensation, Impaired UE functional use  Visit Diagnosis: Cervicalgia  Pain in left arm  Muscle weakness (generalized)     Problem List Patient Active Problem List   Diagnosis Date Noted  . S/P hemorrhoidectomy 07/23/2012  . Hemorrhoids 05/14/2012  . Migraines 05/14/2012   Lulu RidingKristoffer Gen Clagg PT, DPT, LAT, ATC  09/19/16  9:19 AM    [facility] [address] [place], [place], [address] Phone: [phone]   Fax:  [phone]  Name: Anna Hobbs MRN: [id number] Date of Birth: 1966-07-10

## 2016-09-25 ENCOUNTER — Ambulatory Visit: Attending: Family Medicine | Admitting: Physical Therapy

## 2016-09-25 DIAGNOSIS — M542 Cervicalgia: Secondary | ICD-10-CM | POA: Diagnosis not present

## 2016-09-25 DIAGNOSIS — M79602 Pain in left arm: Secondary | ICD-10-CM

## 2016-09-25 DIAGNOSIS — M6281 Muscle weakness (generalized): Secondary | ICD-10-CM

## 2016-09-25 NOTE — Therapy (Signed)
[facility] [address] [place], [place], [address] Phone: [phone]   Fax:  [phone]  Physical Therapy Treatment  Patient Details  Name: Anna Hobbs MRN: [id number] Date of Birth: August 15, 1966 Referring Provider: Delano Metz  Encounter Date: 09/25/2016      PT End of Session - 09/25/16 1238    Visit Number 25   Number of Visits 27   Date for PT Re-Evaluation 10/14/16   PT Start Time 1148   PT Stop Time 1235   PT Time Calculation (min) 47 min   Activity Tolerance Patient tolerated treatment well   Behavior During Therapy [facility] for tasks assessed/performed      Past Medical History:  Diagnosis Date  . Depression   . Dysrhythmia    hx PVC  . Hemorrhoids   . IBS (irritable bowel syndrome)    ruling out  . Migraine     Past Surgical History:  Procedure Laterality Date  . BREAST SURGERY     needle core bx-bilat-neg  . CYSTECTOMY     cyst on neck  . HEMORRHOID SURGERY     Hemorrhoidectomy  . LAPAROSCOPY     to r/o endometriosis  . WISDOM TOOTH EXTRACTION      There were no vitals filed for this visit.      Subjective Assessment - 09/25/16 1149    Subjective "I am doing pretty good today, still sore in the front of the shoulder"    Currently in Pain? Yes   Pain Score 4    Pain Location Shoulder   Pain Orientation Left   Pain Descriptors / Indicators Aching;Pins and needles;Sore   Pain Onset More than a month ago   Pain Frequency Intermittent                         [facility] Adult PT Treatment/Exercise - 09/25/16 0001      Manual Therapy   Manual therapy comments manual trigger point release for sub-clavisu, and pec minor   Joint Mobilization superior clavicle mobs grade 2-3 from middle clavicle to lateral   Neural Stretch median and radial nerve glides and gentle stretching                PT Education - 09/25/16 1237    Education provided Yes   Education Details  anatomy regarding brachial plexus and effect of surrounding musculature, nerve glides and proper form / rationale.   Person(s) Educated Patient   Methods Explanation;Verbal cues;Handout   Comprehension Verbalized understanding;Verbal cues required          PT Short Term Goals - 07/08/16 1518      PT SHORT TERM GOAL #1   Title pt will be I with initial HEP   Time 3   Period Weeks   Status Achieved     PT SHORT TERM GOAL #2   Title pt will report decreased L UE radicular sxs with work duties x 25%   Time 3   Period Weeks   Status Achieved     PT SHORT TERM GOAL #3   Title pt will report decreased suboccipital tension headaches x 25%   Time 3   Period Weeks   Status Achieved     PT SHORT TERM GOAL #4   Title pt will report being able to hold her phone with 5/10 shoulder pain L   Time 3   Period Weeks   Status Achieved  PT Long Term Goals - 09/09/16 1640      PT LONG TERM GOAL #1   Title pt will be able to perform her work duties without L UE radicular sxs >/= 75% of the time   Time 6   Period Weeks   Status On-going     PT LONG TERM GOAL #2   Title pt will be able to hold a grocery bag with her L hand with </= 3/10 L shoulder/cervical pain   Period Weeks   Status On-going     PT LONG TERM GOAL #3   Title pt will be able to position her purse strap across her L clavicle without pain   Time 6   Period Weeks   Status On-going     PT LONG TERM GOAL #4   Title Pt will be able to sleep in L sidelying with 75% less pain   Time 6   Period Weeks   Status On-going               Plan - 09/25/16 1238    Clinical Impression Statement Mrs. Bonczek reported improvement in pain rated at 4/10. following manual trigger point release of the subclavius and pec minor and superior clavicle mobs she reported relief of pain to 2/10. trialed nerve glides median and radial and she reproted no tingling post session.    PT Next Visit Plan nerve glides,  thoracic  outlet??cervical mobilization, DN of the lats and sub-scapularis, mechanical traction.    PT Home Exercise Plan lower trap strengthening, book opening, upper cervical rotaion with 4 finger technique, SNAGS, pec stretch on foam roll. 11/16 [place]   Consulted and Agree with Plan of Care Patient      Patient will benefit from skilled therapeutic intervention in order to improve the following deficits and impairments:  Decreased activity tolerance, Decreased mobility, Decreased range of motion, Decreased strength, Hypomobility, Increased muscle spasms, Impaired flexibility, Impaired sensation, Impaired UE functional use  Visit Diagnosis: Cervicalgia  Pain in left arm  Muscle weakness (generalized)     Problem List Patient Active Problem List   Diagnosis Date Noted  . S/P hemorrhoidectomy 07/23/2012  . Hemorrhoids 05/14/2012  . Migraines 05/14/2012   Lulu RidingKristoffer Carline Dura PT, DPT, LAT, ATC  09/25/16  12:52 PM      [facility] [address] [place], [place], [address] Phone: [phone]   Fax:  [phone]  Name: Anna FennelKaren A Hobbs MRN: [id number] Date of Birth: [birth year]

## 2016-10-02 ENCOUNTER — Ambulatory Visit: Admitting: Physical Therapy

## 2016-10-02 DIAGNOSIS — M79602 Pain in left arm: Secondary | ICD-10-CM

## 2016-10-02 DIAGNOSIS — M542 Cervicalgia: Secondary | ICD-10-CM

## 2016-10-02 DIAGNOSIS — M6281 Muscle weakness (generalized): Secondary | ICD-10-CM

## 2016-10-02 NOTE — Therapy (Signed)
[place], [place], [address] Phone: [phone]   Fax:  [phone]  Physical Therapy Treatment / Discharge Note  Patient Details  Name: Anna Hobbs MRN: [id number] Date of Birth: [birth year] Referring Provider: Ledora Bottcher  Encounter Date: 10/02/2016      PT End of Session - 10/02/16 1556    Visit Number 26   Number of Visits 27   Date for PT Re-Evaluation 10/14/16   PT Start Time 8676   PT Stop Time 1620   PT Time Calculation (min) 34 min   Activity Tolerance Patient tolerated treatment well   Behavior During Therapy [facility] for tasks assessed/performed      Past Medical History:  Diagnosis Date  . Depression   . Dysrhythmia    hx PVC  . Hemorrhoids   . IBS (irritable bowel syndrome)    ruling out  . Migraine     Past Surgical History:  Procedure Laterality Date  . BREAST SURGERY     needle core bx-bilat-neg  . CYSTECTOMY     cyst on neck  . HEMORRHOID SURGERY     Hemorrhoidectomy  . LAPAROSCOPY     to r/o endometriosis  . WISDOM TOOTH EXTRACTION      There were no vitals filed for this visit.      Subjective Assessment - 10/02/16 1547    Subjective "okay, up and down. Some days are better than others. I can only sleep on my right side, if I go to left Im in pain." Pt reported shoulder adduction caused pain.    Currently in Pain? Yes   Pain Score 2    Pain Location Shoulder   Pain Orientation Left   Pain Descriptors / Indicators Aching;Sharp   Pain Type Chronic pain   Pain Onset More than a month ago   Pain Frequency Intermittent   Aggravating Factors  lifting, excessive movement, reaching across body   Pain Relieving Factors heat,             [facility] PT Assessment - 10/02/16 0001      AROM   Cervical Flexion 68   Cervical Extension 72   Cervical - Right Side Bend 40   Cervical - Left Side Bend 42   Cervical - Right Rotation 80   Cervical - Left Rotation 78      Strength   Left Shoulder Flexion 4/5   Left Shoulder Extension 5/5   Left Shoulder ABduction 4/5   Left Shoulder Internal Rotation 4+/5   Left Shoulder External Rotation 4+/5                     [facility] Adult PT Treatment/Exercise - 10/02/16 0001      Manual Therapy   Neural Stretch radial nerve glides and gentle stretching                PT Education - 10/02/16 1700    Education provided Yes   Education Details continuing with current HEP progressing with exercise as able.   Person(s) Educated Patient   Methods Explanation;Verbal cues   Comprehension Verbalized understanding;Verbal cues required          PT Short Term Goals - 07/08/16 1518      PT SHORT TERM GOAL #1   Title pt will be I with initial HEP   Time 3   Period Weeks   Status Achieved     PT SHORT TERM GOAL #2  Title pt will report decreased L UE radicular sxs with work duties x 25%   Time 3   Period Weeks   Status Achieved     PT SHORT TERM GOAL #3   Title pt will report decreased suboccipital tension headaches x 25%   Time 3   Period Weeks   Status Achieved     PT SHORT TERM GOAL #4   Title pt will report being able to hold her phone with 5/10 shoulder pain L   Time 3   Period Weeks   Status Achieved           PT Long Term Goals - 10/02/16 1602      PT LONG TERM GOAL #1   Title pt will be able to perform her work duties without L UE radicular sxs >/= 75% of the time   Time 6   Period Weeks   Status Achieved     PT LONG TERM GOAL #2   Title pt will be able to hold a grocery bag with her L hand with </= 3/10 L shoulder/cervical pain   Time 6   Period Weeks   Status Achieved     PT LONG TERM GOAL #3   Title pt will be able to position her purse strap across her L clavicle without pain   Time 6   Period Weeks   Status Not Met     PT LONG TERM GOAL #4   Title Pt will be able to sleep in L sidelying with 75% less pain   Period Weeks   Status Not Met                Plan - 10/02/16 1650    Clinical Impression Statement Mrs. Lonon continues to report intermittent pain in the L shoulder specifically with horizontal abduction as well as reaching over head. She has improved cervical mobility compred to previous measures as well as L shoulder strength. Worked on manual trigger point release techniques and clavicle mobs to promote mobility and continued radial nerve glides. Based continued pain inthe L shoulder that fluctuates from session to session with minimal carrying over she would benefit from further assessment form her MD or an orthopedic consult. pt will be discharged from PT today.    PT Next Visit Plan D/C   PT Home Exercise Plan lower trap strengthening, book opening, upper cervical rotaion with 4 finger technique, SNAGS, pec stretch on foam roll. 11/16 [place]   Consulted and Agree with Plan of Care Patient      Patient will benefit from skilled therapeutic intervention in order to improve the following deficits and impairments:  Decreased activity tolerance, Decreased mobility, Decreased range of motion, Decreased strength, Hypomobility, Increased muscle spasms, Impaired flexibility, Impaired sensation, Impaired UE functional use  Visit Diagnosis: Cervicalgia  Pain in left arm  Muscle weakness (generalized)     Problem List Patient Active Problem List   Diagnosis Date Noted  . S/P hemorrhoidectomy 07/23/2012  . Hemorrhoids 05/14/2012  . Migraines 05/14/2012   Starr Lake PT, DPT, LAT, ATC  10/02/16  5:05 PM    [facility] [address] [place], [place], [address] Phone: [phone]   Fax:  [phone]  Name: Anna Hobbs MRN: [id number] Date of Birth: 02/12/1966   PHYSICAL THERAPY DISCHARGE SUMMARY  Visits from Start of Care: 26  Current functional level related to goals / functional outcomes: See goals   Remaining deficits: Intermittent  L shoulder  pain with limited strength due to pain. Intermittent tingling in the elbow/ hand.    Education / Equipment: HEP, posture/ lifting and carrying mechanics, dry needling education, theraband  Plan: Patient agrees to discharge.  Patient goals were partially met. Patient is being discharged due to lack of progress.  ?????     Kristoffer Leamon PT, DPT, LAT, ATC  10/02/16  5:08 PM
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
## 2018-07-26 ENCOUNTER — Ambulatory Visit: Payer: Self-pay | Admitting: Neurology

## 2018-07-26 ENCOUNTER — Telehealth: Payer: Self-pay | Admitting: *Deleted

## 2018-07-26 ENCOUNTER — Other Ambulatory Visit: Payer: Self-pay

## 2018-07-26 ENCOUNTER — Encounter: Payer: Self-pay | Admitting: Neurology

## 2018-07-26 VITALS — BP 123/79 | HR 80 | Resp 14 | Ht 62.0 in | Wt 130.0 lb

## 2018-07-26 DIAGNOSIS — M79602 Pain in left arm: Secondary | ICD-10-CM
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
## 2018-07-26 NOTE — Telephone Encounter (Signed)
I spoke with [facility]. Pt. last seen there in July 2018. They will fax MRI reports to our office, fax# [phone]/fim

## 2018-07-26 NOTE — Telephone Encounter (Signed)
-----   Message from York Spaniel, MD sent at 07/26/2018 10:00 AM EST ----- Please call [facility], please have reports of MRIs done on this patient sent to our office.  Thank you.

## 2018-08-25 ENCOUNTER — Ambulatory Visit (INDEPENDENT_AMBULATORY_CARE_PROVIDER_SITE_OTHER): Admitting: Neurology

## 2018-08-25 ENCOUNTER — Encounter: Payer: Self-pay | Admitting: Neurology

## 2018-08-25 DIAGNOSIS — M79602 Pain in left arm: Secondary | ICD-10-CM
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
## 2018-08-25 NOTE — Progress Notes (Signed)
Please refer to EMG and nerve conduction procedure note.  

## 2018-08-25 NOTE — Progress Notes (Signed)
The patient returns for EMG nerve conduction study evaluation.  The studies were unremarkable.  No evidence of a neuropathy or radiculopathy.  The patient is being managed through a pain center, she may also engage in massage therapy on a regular basis.  Trigger point injections may be of some help.
